# Patient Record
Sex: Female | Born: 1995 | Race: Black or African American | Hispanic: No | Marital: Married | Smoking: Former smoker
Health system: Southern US, Community
[De-identification: ages and names within clinical notes are randomized; demographics above are authoritative.]

## PROBLEM LIST (undated history)

## (undated) ENCOUNTER — Inpatient Hospital Stay (HOSPITAL_COMMUNITY): Payer: Self-pay

## (undated) DIAGNOSIS — N189 Chronic kidney disease, unspecified: Secondary | ICD-10-CM

## (undated) DIAGNOSIS — Z789 Other specified health status: Secondary | ICD-10-CM

## (undated) HISTORY — PX: NO PAST SURGERIES: SHX2092

---
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
## 2014-10-21 ENCOUNTER — Encounter (HOSPITAL_BASED_OUTPATIENT_CLINIC_OR_DEPARTMENT_OTHER): Payer: Self-pay | Admitting: Emergency Medicine

## 2014-10-21 ENCOUNTER — Emergency Department (HOSPITAL_BASED_OUTPATIENT_CLINIC_OR_DEPARTMENT_OTHER)
Admission: EM | Admit: 2014-10-21 | Discharge: 2014-10-21 | Disposition: A | Discharge status: Home / Self Care | Payer: 59 | Attending: Emergency Medicine | Admitting: Emergency Medicine

## 2014-10-21 DIAGNOSIS — Z792 Long term (current) use of antibiotics: Secondary | ICD-10-CM | POA: Insufficient documentation

## 2014-10-21 DIAGNOSIS — Z331 Pregnant state, incidental: Secondary | ICD-10-CM | POA: Diagnosis not present

## 2014-10-21 DIAGNOSIS — Z3401 Encounter for supervision of normal first pregnancy, first trimester: Secondary | ICD-10-CM

## 2014-10-21 DIAGNOSIS — R112 Nausea with vomiting, unspecified: Secondary | ICD-10-CM | POA: Diagnosis present

## 2014-10-21 LAB — URINALYSIS, ROUTINE W REFLEX MICROSCOPIC
BILIRUBIN URINE: NEGATIVE
Glucose, UA: NEGATIVE mg/dL
Hgb urine dipstick: NEGATIVE
Ketones, ur: NEGATIVE mg/dL
Leukocytes, UA: NEGATIVE
NITRITE: NEGATIVE
Protein, ur: NEGATIVE mg/dL
SPECIFIC GRAVITY, URINE: 1.007 (ref 1.005–1.030)
UROBILINOGEN UA: 0.2 mg/dL (ref 0.0–1.0)
pH: 6.5 (ref 5.0–8.0)

## 2014-10-21 LAB — PREGNANCY, URINE: PREG TEST UR: POSITIVE — AB

## 2014-10-21 NOTE — ED Notes (Signed)
Pt presents to ED with complaints of N/V for the past 2 weeks. PT states she could be pregnant.

## 2014-10-21 NOTE — Discharge Instructions (Signed)
First Trimester of Pregnancy The first trimester of pregnancy is from week 1 until the end of week 12 (months 1 through 3). During this time, your baby will begin to develop inside you. At 6-8 weeks, the eyes and face are formed, and the heartbeat can be seen on ultrasound. At the end of 12 weeks, all the baby's organs are formed. Prenatal care is all the medical care you receive before the birth of your baby. Make sure you get good prenatal care and follow all of your doctor's instructions. HOME CARE  Medicines  Take medicine only as told by your doctor. Some medicines are safe and some are not during pregnancy.  Take your prenatal vitamins as told by your doctor.  Take medicine that helps you poop (stool softener) as needed if your doctor says it is okay. Diet  Eat regular, healthy meals.  Your doctor will tell you the amount of weight gain that is right for you.  Avoid raw meat and uncooked cheese.  If you feel sick to your stomach (nauseous) or throw up (vomit):  Eat 4 or 5 small meals a day instead of 3 large meals.  Try eating a few soda crackers.  Drink liquids between meals instead of during meals.  If you have a hard time pooping (constipation):  Eat high-fiber foods like fresh vegetables, fruit, and whole grains.  Drink enough fluids to keep your pee (urine) clear or pale yellow. Activity and Exercise  Exercise only as told by your doctor. Stop exercising if you have cramps or pain in your lower belly (abdomen) or low back.  Try to avoid standing for long periods of time. Move your legs often if you must stand in one place for a long time.  Avoid heavy lifting.  Wear low-heeled shoes. Sit and stand up straight.  You can have sex unless your doctor tells you not to. Relief of Pain or Discomfort  Wear a good support bra if your breasts are sore.  Take warm water baths (sitz baths) to soothe pain or discomfort caused by hemorrhoids. Use hemorrhoid cream if your  doctor says it is okay.  Rest with your legs raised if you have leg cramps or low back pain.  Wear support hose if you have puffy, bulging veins (varicose veins) in your legs. Raise (elevate) your feet for 15 minutes, 3-4 times a day. Limit salt in your diet. Prenatal Care  Schedule your prenatal visits by the twelfth week of pregnancy.  Write down your questions. Take them to your prenatal visits.  Keep all your prenatal visits as told by your doctor. Safety  Wear your seat belt at all times when driving.  Make a list of emergency phone numbers. The list should include numbers for family, friends, the hospital, and police and fire departments. General Tips  Ask your doctor for a referral to a local prenatal class. Begin classes no later than at the start of month 6 of your pregnancy.  Ask for help if you need counseling or help with nutrition. Your doctor can give you advice or tell you where to go for help.  Do not use hot tubs, steam rooms, or saunas.  Do not douche or use tampons or scented sanitary pads.  Do not cross your legs for long periods of time.  Avoid litter boxes and soil used by cats.  Avoid all smoking, herbs, and alcohol. Avoid drugs not approved by your doctor.  Visit your dentist. At home, brush your teeth   with a soft toothbrush. Be gentle when you floss. GET HELP IF:  You are dizzy.  You have mild cramps or pressure in your lower belly.  You have a nagging pain in your belly area.  You continue to feel sick to your stomach, throw up, or have watery poop (diarrhea).  You have a bad smelling fluid coming from your vagina.  You have pain with peeing (urination).  You have increased puffiness (swelling) in your face, hands, legs, or ankles. GET HELP RIGHT AWAY IF:   You have a fever.  You are leaking fluid from your vagina.  You have spotting or bleeding from your vagina.  You have very bad belly cramping or pain.  You gain or lose weight  rapidly.  You throw up blood. It may look like coffee grounds.  You are around people who have German measles, fifth disease, or chickenpox.  You have a very bad headache.  You have shortness of breath.  You have any kind of trauma, such as from a fall or a car accident. Document Released: 04/27/2008 Document Revised: 03/26/2014 Document Reviewed: 09/19/2013 Exit[employer] Patient Information 2015 [employer]. This information is not intended to replace advice given to you by your health care provider. Make sure you discuss any questions you have with your health care provider.  

## 2014-10-21 NOTE — ED Provider Notes (Signed)
CSN: [id number]     Arrival date & time 10/21/14  1431 History  This chart was scribed for Tilden FossaElizabeth Nafeesah Lapaglia, MD by Roxy Cedarhandni Bhalodia, ED Scribe. This patient was seen in room MH07/MH07 and the patient's care was started at 5:18 PM.   Chief Complaint  Patient presents with  . Nausea  . Emesis   Patient is a 18 y.o. female presenting with vomiting. The history is provided by the patient. No language interpreter was used.  Emesis Severity:  Moderate Duration:  2 weeks Timing:  Intermittent Number of daily episodes:  1 Quality:  Undigested food Able to tolerate:  Liquids and solids Progression:  Worsening Chronicity:  New Context: not post-tussive   Relieved by:  Nothing Worsened by:  Nothing tried Ineffective treatments:  None tried Associated symptoms: no abdominal pain and no fever    HPI Comments: Krystal Todd is a [age] y.o. female with no chronic medical conditions, who presents to the Emergency Department complaining of nausea and multiple episodes of emesis that began 2 weeks ago. She states she usually has emesis once a day. She is able to keep some food and fluids down. Patient is unsure if she is pregnant. She denies associated fever, abdominal pain, dysuria, diarrhea. She is a smoker and drinks alcohol occasionally. She states her LNMP was 2 months ago.  History reviewed. No pertinent past medical history. History reviewed. No pertinent past surgical history. No family history on file. History  Substance Use Topics  . Smoking status: Never Smoker   . Smokeless tobacco: Not on file  . Alcohol Use: Yes   OB History    No data available     Review of Systems  Gastrointestinal: Positive for nausea and vomiting. Negative for abdominal pain.  All other systems reviewed and are negative.  Allergies  Review of patient's allergies indicates no known allergies.  Home Medications   Prior to Admission medications   Medication Sig Start Date End Date Taking? Authorizing  Provider  metroNIDAZOLE (FLAGYL) 500 MG tablet Take 1 tablet (500 mg total) by mouth 2 (two) times daily. 12/10/13   Hope Orlene OchM Neese, NP  promethazine (PHENERGAN) 25 MG tablet Take 1 tablet (25 mg total) by mouth every 6 (six) hours as needed for nausea. 12/15/12   Gilda Creasehristopher J. Pollina, MD  sulfamethoxazole-trimethoprim (SEPTRA DS) 800-160 MG per tablet Take 1 tablet by mouth every 12 (twelve) hours. 12/15/12   Gilda Creasehristopher J. Pollina, MD   Triage Vitals: BP 137/74 mmHg  Pulse 70  Temp(Src) 97.9 F (36.6 C) (Oral)  Resp 18  Ht 5\' 2"  (1.575 m)  Wt 120 lb (54.432 kg)  BMI 21.94 kg/m2  SpO2 100%  LMP 08/21/2014  Physical Exam  Constitutional: She is oriented to person, place, and time. She appears well-developed and well-nourished.  HENT:  Head: Normocephalic and atraumatic.  Cardiovascular: Normal rate and regular rhythm.   No murmur heard. Pulmonary/Chest: Effort normal and breath sounds normal. No respiratory distress.  Abdominal: Soft. There is no tenderness. There is no rebound and no guarding.  Musculoskeletal: She exhibits no edema or tenderness.  Neurological: She is alert and oriented to person, place, and time.  Skin: Skin is warm and dry.  Psychiatric: She has a normal mood and affect. Her behavior is normal.  Nursing note and vitals reviewed.  ED Course  Procedures (including critical care time)  DIAGNOSTIC STUDIES: Oxygen Saturation is 100% on RA, normal by my interpretation.    COORDINATION OF CARE: 5:20 PM- Discussed plans  to order diagnostic urinalysis, and pregnancy test.   5:24 PM- Discussed positive pregnancy results. Performed ultrasound of abdomen. Discussed plans to discharge. Pt advised of plan for treatment and pt agrees.  Labs Review Labs Reviewed  PREGNANCY, URINE - Abnormal; Notable for the following:    Preg Test, Ur POSITIVE (*)    All other components within normal limits  URINALYSIS, ROUTINE W REFLEX MICROSCOPIC   Imaging Review No results  found.   EKG Interpretation None     MDM   Final diagnoses:  First pregnancy, first trimester    Patient here for evaluation of nausea and vomiting. Patient nontoxic and well-hydrated on exam. Patient with only small volume emesis daily. Do not feel like electrolyte or renal function testing warranted at this time. Patient is pregnant on urinalysis. Bedside ultrasound demonstrates gestational sac with the exact in the uterus. Discussed with patient's OB/GYN follow-up as well as home care with by mouth fluid hydration and prenatal vitamins. Discussed smoking cessation with the patient.  I personally performed the services described in this documentation, which was scribed in my presence. The recorded information has been reviewed and is accurate.  Tilden FossaElizabeth Jaeanna Mccomber, MD 10/22/14 [phone]

## 2014-11-08 ENCOUNTER — Encounter (HOSPITAL_COMMUNITY): Payer: Self-pay

## 2014-11-08 DIAGNOSIS — Z3A08 8 weeks gestation of pregnancy: Secondary | ICD-10-CM | POA: Diagnosis not present

## 2014-11-08 DIAGNOSIS — O2391 Unspecified genitourinary tract infection in pregnancy, first trimester: Secondary | ICD-10-CM | POA: Insufficient documentation

## 2014-11-08 DIAGNOSIS — Z79899 Other long term (current) drug therapy: Secondary | ICD-10-CM | POA: Insufficient documentation

## 2014-11-08 DIAGNOSIS — O209 Hemorrhage in early pregnancy, unspecified: Secondary | ICD-10-CM | POA: Insufficient documentation

## 2014-11-08 NOTE — ED Notes (Signed)
Pt states she is [redacted] weeks pregnant, tonight she complains of bright red spotting for the last hour, no clots.

## 2014-11-09 ENCOUNTER — Emergency Department (HOSPITAL_COMMUNITY)
Admission: EM | Admit: 2014-11-09 | Discharge: 2014-11-09 | Disposition: A | Discharge status: Home / Self Care | Payer: 59 | Attending: Emergency Medicine | Admitting: Emergency Medicine

## 2014-11-09 ENCOUNTER — Emergency Department (HOSPITAL_COMMUNITY): Payer: 59

## 2014-11-09 DIAGNOSIS — O209 Hemorrhage in early pregnancy, unspecified: Secondary | ICD-10-CM

## 2014-11-09 DIAGNOSIS — N939 Abnormal uterine and vaginal bleeding, unspecified: Secondary | ICD-10-CM

## 2014-11-09 DIAGNOSIS — N390 Urinary tract infection, site not specified: Secondary | ICD-10-CM

## 2014-11-09 DIAGNOSIS — O2341 Unspecified infection of urinary tract in pregnancy, first trimester: Secondary | ICD-10-CM

## 2014-11-09 LAB — WET PREP, GENITAL
Clue Cells Wet Prep HPF POC: NONE SEEN
Trich, Wet Prep: NONE SEEN
Yeast Wet Prep HPF POC: NONE SEEN

## 2014-11-09 LAB — URINALYSIS, ROUTINE W REFLEX MICROSCOPIC
BILIRUBIN URINE: NEGATIVE
GLUCOSE, UA: NEGATIVE mg/dL
KETONES UR: NEGATIVE mg/dL
LEUKOCYTES UA: NEGATIVE
Nitrite: NEGATIVE
PROTEIN: NEGATIVE mg/dL
Specific Gravity, Urine: 1.025 (ref 1.005–1.030)
Urobilinogen, UA: 0.2 mg/dL (ref 0.0–1.0)
pH: 6 (ref 5.0–8.0)

## 2014-11-09 LAB — URINE MICROSCOPIC-ADD ON

## 2014-11-09 LAB — HIV ANTIBODY (ROUTINE TESTING W REFLEX): HIV: NONREACTIVE

## 2014-11-09 LAB — HCG, QUANTITATIVE, PREGNANCY: HCG, BETA CHAIN, QUANT, S: 90422 m[IU]/mL — AB (ref <5)

## 2014-11-09 MED ORDER — CEPHALEXIN 500 MG PO CAPS: 500.0000 mg | ORAL_CAPSULE | Freq: Two times a day (BID) | ORAL | Status: DC

## 2014-11-09 NOTE — ED Provider Notes (Signed)
CSN: [id number]     Arrival date & time 11/08/14  2304 History   First MD Initiated Contact with Patient 11/09/14 0017     Chief Complaint  Patient presents with  . Vaginal Bleeding     (Consider location/radiation/quality/duration/timing/severity/associated sxs/prior Treatment) HPI  Krystal Todd is a 18 y.o. female G1 P0 at [redacted] weeks gestation (per the patient) presenting for bright red setting over the last hour. She denies any large blood clots or any abdominal pain. This is her first pregnancy. She denies taking any medications and has not obtained a formal ultrasound or obstetrics appointment. She's denying any fevers or recent infections. She has no urinary symptoms. Patient has no further complaints.  10 Systems reviewed and are negative for acute change except as noted in the HPI.   History reviewed. No pertinent past medical history. History reviewed. No pertinent past surgical history. History reviewed. No pertinent family history. History  Substance Use Topics  . Smoking status: Never Smoker   . Smokeless tobacco: Not on file  . Alcohol Use: Yes   OB History    No data available     Review of Systems    Allergies  Review of patient's allergies indicates no known allergies.  Home Medications   Prior to Admission medications   Medication Sig Start Date End Date Taking? Authorizing Provider  metroNIDAZOLE (FLAGYL) 500 MG tablet Take 1 tablet (500 mg total) by mouth 2 (two) times daily. 12/10/13   Hope Orlene OchM Neese, NP  promethazine (PHENERGAN) 25 MG tablet Take 1 tablet (25 mg total) by mouth every 6 (six) hours as needed for nausea. 12/15/12   Gilda Creasehristopher J. Pollina, MD  sulfamethoxazole-trimethoprim (SEPTRA DS) 800-160 MG per tablet Take 1 tablet by mouth every 12 (twelve) hours. 12/15/12   Gilda Creasehristopher J. Pollina, MD   BP 121/73 mmHg  Pulse 90  Temp(Src) 99.7 F (37.6 C) (Oral)  Resp 20  SpO2 100%  LMP 08/21/2014 Physical Exam  Constitutional: She is oriented  to person, place, and time. She appears well-developed and well-nourished. No distress.  HENT:  Head: Normocephalic and atraumatic.  Nose: Nose normal.  Mouth/Throat: Oropharynx is clear and moist. No oropharyngeal exudate.  Eyes: Conjunctivae and EOM are normal. Pupils are equal, round, and reactive to light. No scleral icterus.  Neck: Normal range of motion. Neck supple. No JVD present. No tracheal deviation present. No thyromegaly present.  Cardiovascular: Normal rate, regular rhythm and normal heart sounds.  Exam reveals no gallop and no friction rub.   No murmur heard. Pulmonary/Chest: Effort normal and breath sounds normal. No respiratory distress. She has no wheezes. She exhibits no tenderness.  Abdominal: Soft. Bowel sounds are normal. She exhibits no distension and no mass. There is no tenderness. There is no rebound and no guarding.  Genitourinary:  Cervical os is closed.  Musculoskeletal: Normal range of motion. She exhibits no edema or tenderness.  Lymphadenopathy:    She has no cervical adenopathy.  Neurological: She is alert and oriented to person, place, and time. No cranial nerve deficit. She exhibits normal muscle tone.  Skin: Skin is warm and dry. No rash noted. She is not diaphoretic. No erythema. No pallor.  Nursing note and vitals reviewed.   ED Course  Procedures (including critical care time) Labs Review Labs Reviewed  WET PREP, GENITAL - Abnormal; Notable for the following:    WBC, Wet Prep HPF POC RARE (*)    All other components within normal limits  URINALYSIS, ROUTINE W REFLEX  MICROSCOPIC - Abnormal; Notable for the following:    Hgb urine dipstick LARGE (*)    All other components within normal limits  HCG, QUANTITATIVE, PREGNANCY - Abnormal; Notable for the following:    hCG, Beta Chain, Quant, S 16109 (*)    All other components within normal limits  URINE MICROSCOPIC-ADD ON - Abnormal; Notable for the following:    Squamous Epithelial / LPF FEW (*)     Bacteria, UA MANY (*)    All other components within normal limits  URINE CULTURE  GC/CHLAMYDIA PROBE AMP  HIV ANTIBODY (ROUTINE TESTING)    Imaging Review US Ob Comp Less 14 Wks  11/09/2014   CLINICAL DATA:  Vaginal bleeding for 1 day, [redacted] weeks pregnant.  EXAM: OBSTETRIC <14 WK [place] AND TRANSVAGINAL OB US  TECHNIQUE: Both transabdominal and transvaginal ultrasound examinations were performed for complete evaluation of the gestation as well as the maternal uterus, adnexal regions, and pelvic cul-de-sac. Transvaginal technique was performed to assess early pregnancy.  COMPARISON:  None.  FINDINGS: Intrauterine gestational sac: Visualized/normal in shape.  Yolk sac:  Present  Embryo:  Present  Cardiac Activity: Present  Heart Rate:  185 bpm  CRL:   23.7  mm   9 w 1 d                  [place] EDC: June 13, 2015  Maternal uterus/adnexae: Adnexal not identified. Normal appearance of the uterus. No free fluid.  IMPRESSION: Single live intrauterine pregnancy, gestational age [redacted] weeks and 1 day by ultrasound, EDD June 13, 2015 without immediate complications.   Electronically Signed   By: Awilda Metro   On: 11/09/2014 02:26   US Ob Transvaginal  11/09/2014   CLINICAL DATA:  Vaginal bleeding for 1 day, [redacted] weeks pregnant.  EXAM: OBSTETRIC <14 WK [place] AND TRANSVAGINAL OB US  TECHNIQUE: Both transabdominal and transvaginal ultrasound examinations were performed for complete evaluation of the gestation as well as the maternal uterus, adnexal regions, and pelvic cul-de-sac. Transvaginal technique was performed to assess early pregnancy.  COMPARISON:  None.  FINDINGS: Intrauterine gestational sac: Visualized/normal in shape.  Yolk sac:  Present  Embryo:  Present  Cardiac Activity: Present  Heart Rate:  185 bpm  CRL:   23.7  mm   9 w 1 d                  [place] EDC: June 13, 2015  Maternal uterus/adnexae: Adnexal not identified. Normal appearance of the uterus. No free fluid.  IMPRESSION: Single live intrauterine pregnancy,  gestational age [redacted] weeks and 1 day by ultrasound, EDD June 13, 2015 without immediate complications.   Electronically Signed   By: Awilda Metro   On: 11/09/2014 02:26     EKG Interpretation None      MDM   Final diagnoses:  Vaginal bleeding before [redacted] weeks gestation    Patient presents emergency department out of concern for vaginal bleeding.   UA shows bacteruria, will treat in the setting of pregnancy.  Ultrasound reveals live IUP. Ectopic pregnancy. My pelvic exam reveals a closed cervical os. Patient was advised to follow-up with an OB/GYN physician within 3 days for continued management. Her vital signs were within her normal limits and she is safe for discharge.  EMERGENCY DEPARTMENT [place] PREGNANCY "Study: Limited Ultrasound of the Pelvis"  INDICATIONS:Vaginal bleeding Multiple views of the uterus and pelvic cavity are obtained with a multi-frequency probe.  APPROACH:Transabdominal   PERFORMED BY: Myself  IMAGES ARCHIVED?: Yes  LIMITATIONS: Decompressed bladder  PREGNANCY FREE FLUID: None  PREGNANCY UTERUS FINDINGS:Uterus enlarged ADNEXAL FINDINGS:Right ovary not seen  PREGNANCY FINDINGS: Fetal heart activity seen  INTERPRETATION: Viable intrauterine pregnancy  GESTATIONAL AGE, ESTIMATE: 8 weeks per patient  FETAL HEART RATE: did not obtain       Tomasita CrumbleAdeleke Gaylyn Berish, MD 11/09/14 [phone]

## 2014-11-09 NOTE — Discharge Instructions (Signed)
Asymptomatic Bacteriuria [name], you were seen today for vaginal bleeding. Your ultrasound results are below. Your urine does show bacteria, please take antibiotics as prescribed. Follow-up with an OB/GYN physician within 3 days for continued management. If symptoms worsen come back to emergency department and medially for repeat evaluation. Thank you.  IMPRESSION: Single live intrauterine pregnancy, gestational age 539 weeks and 1 day by ultrasound, EDD June 13, 2015 without immediate Complications.   Asymptomatic bacteriuria is the presence of a large number of bacteria in your urine without the usual symptoms of burning or frequent urination. The following conditions increase the risk of asymptomatic bacteriuria:  Diabetes mellitus.  Advanced age.  Pregnancy in the first trimester.  Kidney stones.  Kidney transplants.  Leaky kidney tube valve in young children (reflux). Treatment for this condition is not needed in most people and can lead to other problems such as too much yeast and growth of resistant bacteria. However, some people, such as pregnant women, do need treatment to prevent kidney infection. Asymptomatic bacteriuria in pregnancy is also associated with fetal growth restriction, premature labor, and newborn death. HOME CARE INSTRUCTIONS Monitor your condition for any changes. The following actions may help to relieve any discomfort you are feeling:  Drink enough water and fluids to keep your urine clear or pale yellow. Go to the bathroom more often to keep your bladder empty.  Keep the area around your vagina and rectum clean. Wipe yourself from front to back after urinating. SEEK IMMEDIATE MEDICAL CARE IF:  You develop signs of an infection such as:  Burning with urination.  Frequency of voiding.  Back pain.  Fever.  You have blood in the urine.  You develop a fever. MAKE SURE YOU:  Understand these instructions.  Will watch your condition.  Will  get help right away if you are not doing well or get worse. Document Released: 11/09/2005 Document Revised: 03/26/2014 Document Reviewed: 05/01/2013 [facility] Patient Information 2015 [place], [place]. This information is not intended to replace advice given to you by your health care provider. Make sure you discuss any questions you have with your health care provider. Vaginal Bleeding During Pregnancy, First Trimester A small amount of bleeding (spotting) from the vagina is common in early pregnancy. Sometimes the bleeding is normal and is not a problem, and sometimes it is a sign of something serious. Be sure to tell your doctor about any bleeding from your vagina right away. HOME CARE  Watch your condition for any changes.  Follow your doctor's instructions about how active you can be.  If you are on bed rest:  You may need to stay in bed and only get up to use the bathroom.  You may be allowed to do some activities.  If you need help, make plans for someone to help you.  Write down:  The number of pads you use each day.  How often you change pads.  How soaked (saturated) your pads are.  Do not use tampons.  Do not douche.  Do not have sex or orgasms until your doctor says it is okay.  If you pass any tissue from your vagina, save the tissue so you can show it to your doctor.  Only take medicines as told by your doctor.  Do not take aspirin because it can make you bleed.  Keep all follow-up visits as told by your doctor. GET HELP IF:   You bleed from your vagina.  You have cramps.  You have labor pains.  You have a fever that does not go away after you take medicine. GET HELP RIGHT AWAY IF:   You have very bad cramps in your back or belly (abdomen).  You pass large clots or tissue from your vagina.  You bleed more.  You feel light-headed or weak.  You pass out (faint).  You have chills.  You are leaking fluid or have a gush of fluid from your  vagina.  You pass out while pooping (having a bowel movement). MAKE SURE YOU:  Understand these instructions.  Will watch your condition.  Will get help right away if you are not doing well or get worse. Document Released: 03/26/2014 Document Reviewed: 07/17/2013 [facility] Patient Information 2015 [place], [place]. This information is not intended to replace advice given to you by your health care provider. Make sure you discuss any questions you have with your health care provider.

## 2014-11-10 LAB — URINE CULTURE: Colony Count: 60000

## 2014-11-10 LAB — GC/CHLAMYDIA PROBE AMP
CT Probe RNA: NEGATIVE
GC Probe RNA: NEGATIVE

## 2014-12-27 LAB — OB RESULTS CONSOLE HIV ANTIBODY (ROUTINE TESTING): HIV: NONREACTIVE

## 2014-12-27 LAB — OB RESULTS CONSOLE GC/CHLAMYDIA
Chlamydia: NEGATIVE
GC PROBE AMP, GENITAL: NEGATIVE

## 2014-12-27 LAB — OB RESULTS CONSOLE ABO/RH: RH Type: POSITIVE

## 2014-12-27 LAB — OB RESULTS CONSOLE RUBELLA ANTIBODY, IGM: Rubella: IMMUNE

## 2014-12-27 LAB — OB RESULTS CONSOLE ANTIBODY SCREEN: Antibody Screen: NEGATIVE

## 2014-12-27 LAB — OB RESULTS CONSOLE HEPATITIS B SURFACE ANTIGEN: Hepatitis B Surface Ag: NEGATIVE

## 2014-12-27 LAB — OB RESULTS CONSOLE RPR: RPR: NONREACTIVE

## 2015-03-08 ENCOUNTER — Emergency Department (HOSPITAL_COMMUNITY)
Admission: EM | Admit: 2015-03-08 | Discharge: 2015-03-09 | Disposition: A | Discharge status: Home / Self Care | Payer: Medicaid Other | Attending: Emergency Medicine | Admitting: Emergency Medicine

## 2015-03-08 ENCOUNTER — Encounter (HOSPITAL_COMMUNITY): Payer: Self-pay | Admitting: Emergency Medicine

## 2015-03-08 DIAGNOSIS — R109 Unspecified abdominal pain: Secondary | ICD-10-CM | POA: Diagnosis not present

## 2015-03-08 DIAGNOSIS — O9989 Other specified diseases and conditions complicating pregnancy, childbirth and the puerperium: Secondary | ICD-10-CM | POA: Insufficient documentation

## 2015-03-08 DIAGNOSIS — M549 Dorsalgia, unspecified: Secondary | ICD-10-CM | POA: Insufficient documentation

## 2015-03-08 DIAGNOSIS — O26899 Other specified pregnancy related conditions, unspecified trimester: Secondary | ICD-10-CM

## 2015-03-08 DIAGNOSIS — Z79899 Other long term (current) drug therapy: Secondary | ICD-10-CM | POA: Insufficient documentation

## 2015-03-08 LAB — URINALYSIS, ROUTINE W REFLEX MICROSCOPIC
Bilirubin Urine: NEGATIVE
GLUCOSE, UA: 250 mg/dL — AB
Ketones, ur: NEGATIVE mg/dL
Nitrite: NEGATIVE
Protein, ur: NEGATIVE mg/dL
Specific Gravity, Urine: 1.019 (ref 1.005–1.030)
UROBILINOGEN UA: 0.2 mg/dL (ref 0.0–1.0)
pH: 6.5 (ref 5.0–8.0)

## 2015-03-08 LAB — URINE MICROSCOPIC-ADD ON

## 2015-03-08 MED ORDER — SODIUM CHLORIDE 0.9 % IV BOLUS (SEPSIS)
1000.0000 mL | Freq: Once | INTRAVENOUS | Status: AC
  Administered 2015-03-08: 1000 mL via INTRAVENOUS

## 2015-03-08 NOTE — ED Notes (Signed)
Awake. Verbally responsive. Resp even and unlabored. No audible adventitious breath sounds noted. ABC's intact. Abd soft/nondistended but tender to palpate. BS (+) and active x4 quadrants. No N/V/D reported. Pt reported abd tightness and 6months pregnant. Pt reported that the tightness has eased up some.

## 2015-03-08 NOTE — ED Notes (Signed)
OB nurse at bedside 

## 2015-03-08 NOTE — ED Provider Notes (Signed)
CSN: [id number]     Arrival date & time 03/08/15  2137 History   First MD Initiated Contact with Patient 03/08/15 2211     Chief Complaint  Patient presents with  . abdominal tightness      (Consider location/radiation/quality/duration/timing/severity/associated sxs/prior Treatment) The history is provided by the patient and medical records. No language interpreter was used.     Krystal Todd is a 19 y.o. female  G1P0 with a no major medical problems presents to the Emergency Department complaining of gradual, persistent, progressively worsening and now improving "abdominal tightness" onset approx 1 hour PTA.  Pt reports that 2 hours PTA she began to have low back pain that was bilateral and constant.  She reports she then became angry and "flipped out on someone."  She reports that while this was happening she began to feel her abd tighten. She states it became tight and stayed that way until after her arrival in the ED.  She denies waxing and waning tightness and all abd pain.  She denies being hit or kicked in the abdomen. She denies vaginal discharge or leakage of fluid. [facility]  Pt reports normal pregnancy to this point. Nothing seems to make the symptoms better or worse.  She denies fever, chills, headache, neck pain, chest pain, shortness of breath, nausea, vomiting, diarrhea, weakness, dizziness, syncope, dysuria.   History reviewed. No pertinent past medical history. History reviewed. No pertinent past surgical history. History reviewed. No pertinent family history. History  Substance Use Topics  . Smoking status: Never Smoker   . Smokeless tobacco: Never Used  . Alcohol Use: No   OB History    Gravida Para Term Preterm AB TAB SAB Ectopic Multiple Living   1              Review of Systems  Constitutional: Negative for fever, diaphoresis, appetite change, fatigue and unexpected weight change.  HENT: Negative for mouth sores.   Eyes: Negative  for visual disturbance.  Respiratory: Negative for cough, chest tightness, shortness of breath and wheezing.   Cardiovascular: Negative for chest pain.  Gastrointestinal: Positive for abdominal pain. Negative for nausea, vomiting, diarrhea and constipation.  Endocrine: Negative for polydipsia, polyphagia and polyuria.  Genitourinary: Negative for dysuria, urgency, frequency and hematuria.  Musculoskeletal: Positive for back pain. Negative for neck stiffness.  Skin: Negative for rash.  Allergic/Immunologic: Negative for immunocompromised state.  Neurological: Negative for syncope, light-headedness and headaches.  Hematological: Does not bruise/bleed easily.  Psychiatric/Behavioral: Negative for sleep disturbance. The patient is not nervous/anxious.       Allergies  Review of patient's allergies indicates no known allergies.  Home Medications   Prior to Admission medications   Medication Sig Start Date End Date Taking? Authorizing Provider  cephALEXin (KEFLEX) 500 MG capsule Take 1 capsule (500 mg total) by mouth 2 (two) times daily. Patient not taking: Reported on 03/08/2015 11/09/14   Tomasita CrumbleAdeleke Oni, MD  Prenatal Vit-Fe Fumarate-FA (PRENATAL MULTIVITAMIN) TABS tablet Take 1 tablet by mouth daily at 12 noon.    Historical Provider, MD   BP 118/73 mmHg  Pulse 96  Temp(Src) 98.3 F (36.8 C) (Oral)  Resp 18  SpO2 100%  LMP 08/21/2014 Physical Exam  Constitutional: She appears well-developed and well-nourished. No distress.  Awake, alert, nontoxic appearance  HENT:  Head: Normocephalic and atraumatic.  Mouth/Throat: Oropharynx is clear and moist. No oropharyngeal exudate.  Eyes: Conjunctivae are normal. No scleral icterus.  Neck: Normal range of motion. Neck supple.  Cardiovascular: Normal rate, regular rhythm, normal heart sounds and intact distal pulses.   Pulmonary/Chest: Effort normal and breath sounds normal. No respiratory distress. She has no wheezes.  Equal chest expansion   Abdominal: Soft. Bowel sounds are normal. She exhibits no mass. There is no tenderness. There is no rebound, no guarding and no CVA tenderness.  Gravid uterus palpable Abdomen soft and nontender No palpable contractions No CVA tenderness  Musculoskeletal: Normal range of motion. She exhibits no edema.  Neurological: She is alert.  Speech is clear and goal oriented Moves extremities without ataxia  Skin: Skin is warm and dry. She is not diaphoretic.  Psychiatric: She has a normal mood and affect.  Nursing note and vitals reviewed.   ED Course  Procedures (including critical care time) Labs Review Labs Reviewed  URINALYSIS, ROUTINE W REFLEX MICROSCOPIC - Abnormal; Notable for the following:    APPearance CLOUDY (*)    Glucose, UA 250 (*)    Hgb urine dipstick SMALL (*)    Leukocytes, UA LARGE (*)    All other components within normal limits  URINE MICROSCOPIC-ADD ON - Abnormal; Notable for the following:    Squamous Epithelial / LPF FEW (*)    Bacteria, UA FEW (*)    Crystals CA OXALATE CRYSTALS (*)    All other components within normal limits  URINE CULTURE    Imaging Review No results found.   EKG Interpretation None      MDM   Final diagnoses:  Abdominal pain complicating pregnancy   Krystal Todd presents with abdominal tightness after stressful event. Patient denies discrete or repetitive contractions however rapid OB nurse at bedside and toco placed with 1 clear contraction since onset of monitoring. Will give fluids, check UA and monitor for approximately one hour.  12:14 AM Pt continues to contract approx 2/28min.  Will continue to monitor, finish fluids and discuss with her OB/GYN.  Urine is borderline with large leukocytes but no nitrites. White blood cells 3-6.  Pt denies urinary symptoms.  12:47 AM Pt with decreasing frequency of contractions. A rapid OB and nurse has spoken with patient's personal OB who recommends discharge home with close  outpatient follow-up. Patient denies all urinary symptoms. Culture pending and OB/GYN will follow on culture.  Dahlia Client Timothy Trudell, PA-C 03/09/15 1610  Rolan Bucco, MD 03/09/15 [phone]

## 2015-03-08 NOTE — ED Notes (Signed)
Awake. Verbally responsive. A/O x4. Resp even and unlabored. No audible adventitious breath sounds noted. ABC's intact. IV in NS at 91499ml/hr without difficulty. Family at bedside. OB nurse at bedside. Pt connected to fetal monitor.

## 2015-03-08 NOTE — ED Notes (Signed)
Pt reports she flipped out on her neighbors and now her stomach is tight with lower back pain. Pt is [redacted] weeks pregnant.

## 2015-03-09 NOTE — ED Notes (Signed)
Awake. Verbally responsive. A/O x4. Resp even and unlabored. No audible adventitious breath sounds noted. ABC's intact. IV infused NS without difficulty. Continues to be monitor on fetal monitoring.  Family at bedside.

## 2015-03-09 NOTE — ED Notes (Signed)
Awake. Verbally responsive. A/O x4. Resp even and unlabored. No audible adventitious breath sounds noted. ABC's intact.  

## 2015-03-09 NOTE — Progress Notes (Signed)
Notified Dr. Jackelyn KnifeMeisinger of patient @ [facility], complaints and interventions. FHT 140/10x10 accels/no decels, no contractions since IV fluid completed. Dr. Jackelyn KnifeMeisinger ok w/discharge of patient.

## 2015-03-09 NOTE — Discharge Instructions (Signed)
1. Medications: usual home medications 2. Treatment: rest, drink plenty of fluids,  3. Follow Up: Please followup with your OB/GYN in 3 days for discussion of your diagnoses and further evaluation after today's visit; if you do not have a primary care doctor use the resource guide provided to find one; Please return to the ER for return of abd pain or tightness or other concerning symptoms

## 2015-03-11 LAB — URINE CULTURE: Colony Count: 75000

## 2015-04-14 ENCOUNTER — Inpatient Hospital Stay (HOSPITAL_COMMUNITY)
Admission: AD | Admit: 2015-04-14 | Discharge: 2015-04-14 | Disposition: A | Discharge status: Home / Self Care | Payer: Medicaid Other | Source: Ambulatory Visit | Attending: Obstetrics and Gynecology | Admitting: Obstetrics and Gynecology

## 2015-04-14 ENCOUNTER — Encounter (HOSPITAL_COMMUNITY): Payer: Self-pay | Admitting: *Deleted

## 2015-04-14 DIAGNOSIS — Z3A31 31 weeks gestation of pregnancy: Secondary | ICD-10-CM | POA: Diagnosis not present

## 2015-04-14 DIAGNOSIS — O4703 False labor before 37 completed weeks of gestation, third trimester: Secondary | ICD-10-CM

## 2015-04-14 DIAGNOSIS — Z87891 Personal history of nicotine dependence: Secondary | ICD-10-CM | POA: Diagnosis not present

## 2015-04-14 DIAGNOSIS — Z79899 Other long term (current) drug therapy: Secondary | ICD-10-CM | POA: Insufficient documentation

## 2015-04-14 HISTORY — DX: Other specified health status: Z78.9

## 2015-04-14 LAB — WET PREP, GENITAL
Clue Cells Wet Prep HPF POC: NONE SEEN
Trich, Wet Prep: NONE SEEN

## 2015-04-14 LAB — URINE MICROSCOPIC-ADD ON

## 2015-04-14 LAB — URINALYSIS, ROUTINE W REFLEX MICROSCOPIC
Bilirubin Urine: NEGATIVE
Glucose, UA: NEGATIVE mg/dL
Hgb urine dipstick: NEGATIVE
KETONES UR: NEGATIVE mg/dL
Nitrite: NEGATIVE
PROTEIN: NEGATIVE mg/dL
Specific Gravity, Urine: 1.015 (ref 1.005–1.030)
Urobilinogen, UA: 0.2 mg/dL (ref 0.0–1.0)
pH: 6 (ref 5.0–8.0)

## 2015-04-14 LAB — FETAL FIBRONECTIN: Fetal Fibronectin: NEGATIVE

## 2015-04-14 MED ORDER — TERBUTALINE SULFATE 1 MG/ML IJ SOLN
0.2500 mg | Freq: Once | INTRAMUSCULAR | Status: AC
  Administered 2015-04-14: 0.25 mg via SUBCUTANEOUS
  Filled 2015-04-14: qty 1

## 2015-04-14 NOTE — MAU Note (Signed)
Since about lunch time Sat. Having period-like cramps and lower back pain. Pain is constant. Denies LOF or bleeding

## 2015-04-14 NOTE — Progress Notes (Signed)
Baby moving a lot and difficult to monitor FHR. Transducer adj

## 2015-04-14 NOTE — MAU Provider Note (Signed)
History     CSN: [id number]  Arrival date and time: 04/14/15 4098   First Provider Initiated Contact with Patient 04/14/15 0058      No chief complaint on file.  HPI  19 y.o. G1P0  presents to the MAU stating that she has had consistent not increasing in strength menstrual type cramps since noon today. Senies intercourse, LOF, vaginal bleeding.  Past Medical History  Diagnosis Date  . Medical history non-contributory     Past Surgical History  Procedure Laterality Date  . No past surgeries      Family History  Problem Relation Age of Onset  . Cancer Maternal Grandmother     History  Substance Use Topics  . Smoking status: Former Games developer  . Smokeless tobacco: Never Used  . Alcohol Use: No    Allergies: No Known Allergies  Prescriptions prior to admission  Medication Sig Dispense Refill Last Dose  . ferrous sulfate 325 (65 FE) MG tablet Take 325 mg by mouth daily with breakfast.   04/13/2015 at Unknown time  . Prenatal Vit-Fe Fumarate-FA (PRENATAL MULTIVITAMIN) TABS tablet Take 1 tablet by mouth daily at 12 noon.   Past Week at Unknown time  . cephALEXin (KEFLEX) 500 MG capsule Take 1 capsule (500 mg total) by mouth 2 (two) times daily. (Patient not taking: Reported on 03/08/2015) 14 capsule 0     Review of Systems  Constitutional: Negative for fever.  Gastrointestinal: Positive for abdominal pain.  Genitourinary: Negative for dysuria.  All other systems reviewed and are negative.  Physical Exam   Blood pressure 119/66, pulse 87, temperature 98.1 F (36.7 C), resp. rate 18, height  (1.575 m), weight 68.04 kg (150 lb), last menstrual period 08/21/2014.  Physical Exam  Nursing note and vitals reviewed. Constitutional: She is oriented to person, place, and time. She appears well-developed and well-nourished. No distress.  HENT:  Head: Normocephalic and atraumatic.  Neck: Normal range of motion.  Cardiovascular: Normal rate.   Respiratory: Effort  normal. No respiratory distress.  GI: Soft. She exhibits no distension and no mass. There is no tenderness. There is no rebound and no guarding.  Genitourinary: Vagina normal.  Musculoskeletal: Normal range of motion. She exhibits no edema.  Neurological: She is alert and oriented to person, place, and time.  Skin: Skin is warm and dry.  Psychiatric: She has a normal mood and affect. Her behavior is normal. Judgment and thought content normal.   Dilation: Closed Effacement (%): Thick Cervical Position: Anterior Exam by:: Quintella Baton rNc    Cervix rechecked and no cervical change noted.   Fhr reactive uc's irregular 5-9 minutes apart. Results for orders placed or performed during the hospital encounter of 04/14/15 (from the past 24 hour(s))  Urinalysis, Routine w reflex microscopic     Status: Abnormal   Collection Time: 04/14/15 12:55 AM  Result Value Ref Range   Color, Urine YELLOW YELLOW   APPearance CLEAR CLEAR   Specific Gravity, Urine 1.015 1.005 - 1.030   pH 6.0 5.0 - 8.0   Glucose, UA NEGATIVE NEGATIVE mg/dL   Hgb urine dipstick NEGATIVE NEGATIVE   Bilirubin Urine NEGATIVE NEGATIVE   Ketones, ur NEGATIVE NEGATIVE mg/dL   Protein, ur NEGATIVE NEGATIVE mg/dL   Urobilinogen, UA 0.2 0.0 - 1.0 mg/dL   Nitrite NEGATIVE NEGATIVE   Leukocytes, UA MODERATE (A) NEGATIVE  Urine microscopic-add on     Status: Abnormal   Collection Time: 04/14/15 12:55 AM  Result Value Ref Range  Squamous Epithelial / LPF FEW (A) RARE   WBC, UA 3-6 <3 WBC/hpf   Bacteria, UA RARE RARE  Fetal fibronectin     Status: None   Collection Time: 04/14/15  1:30 AM  Result Value Ref Range   Fetal Fibronectin NEGATIVE NEGATIVE  Wet prep, genital     Status: Abnormal   Collection Time: 04/14/15  1:30 AM  Result Value Ref Range   Yeast Wet Prep HPF POC FEW (A) NONE SEEN   Trich, Wet Prep NONE SEEN NONE SEEN   Clue Cells Wet Prep HPF POC NONE SEEN NONE SEEN   WBC, Wet Prep HPF POC FEW (A) NONE SEEN    MAU Course  Procedures  MDM Fetal fibronectin negative and cervix has not changed with irregular contractions. Reported to Dr Ambrose MantleHenley and he wants her to have terbutaline x 1 dose, recheck her cervix and if no change . She may be discharged.  Assessment and Plan  Preterm contractions  Discharge to home  [facility]Clemmons,Kona Yusuf Grissett 04/14/2015, 2:00 AM

## 2015-04-14 NOTE — Progress Notes (Signed)
Written and verbal d/c instructions given and understanding voiced. 

## 2015-04-14 NOTE — Progress Notes (Signed)
Illene BolusLori Clemmons CNM in discussing test results and plan of care

## 2015-04-14 NOTE — Progress Notes (Signed)
Lori Clemmons CNM aware cervix unchanged and irreg ctx with u/i. May d/c home. Aware some yeast on wet prep -no treatment ordered.

## 2015-04-14 NOTE — Progress Notes (Signed)
BAby active and difficult to monitor fhr.

## 2015-04-14 NOTE — Discharge Instructions (Signed)

## 2015-05-15 LAB — OB RESULTS CONSOLE GBS: GBS: NEGATIVE

## 2015-05-28 ENCOUNTER — Encounter (HOSPITAL_COMMUNITY): Payer: Self-pay | Admitting: *Deleted

## 2015-05-28 ENCOUNTER — Inpatient Hospital Stay (HOSPITAL_COMMUNITY)
Admission: AD | Admit: 2015-05-28 | Discharge: 2015-05-28 | Disposition: A | Discharge status: Home / Self Care | Payer: Medicaid Other | Source: Ambulatory Visit | Attending: Obstetrics and Gynecology | Admitting: Obstetrics and Gynecology

## 2015-05-28 DIAGNOSIS — Z3A37 37 weeks gestation of pregnancy: Secondary | ICD-10-CM | POA: Diagnosis not present

## 2015-05-28 DIAGNOSIS — O26893 Other specified pregnancy related conditions, third trimester: Secondary | ICD-10-CM

## 2015-05-28 DIAGNOSIS — O9989 Other specified diseases and conditions complicating pregnancy, childbirth and the puerperium: Secondary | ICD-10-CM | POA: Diagnosis present

## 2015-05-28 DIAGNOSIS — N898 Other specified noninflammatory disorders of vagina: Secondary | ICD-10-CM

## 2015-05-28 DIAGNOSIS — Z87891 Personal history of nicotine dependence: Secondary | ICD-10-CM | POA: Diagnosis not present

## 2015-05-28 NOTE — MAU Note (Signed)
Ferning not noted, Artelia LarocheM Williams CNM to perform speculum.

## 2015-05-28 NOTE — MAU Provider Note (Signed)
  History     CSN: [id number]  Arrival date and time: 05/28/15 0155   First Provider Initiated Contact with Patient 05/28/15 0257      Chief Complaint  Patient presents with  . Rupture of Membranes   HPI This is a 19 y.o. female at 398w6d who presents with c/o several small gushes of fluid from vagina over past hour. Denies bleeding. Reports good fetal movement. Denies painful contractions I was asked to rule out ruptured membranes   RN Note: Several small gushes of fluid over the last 45 minutes. Denies vaginal bleeding or pain. Positive fetal movement.           OB History    Gravida Para Term Preterm AB TAB SAB Ectopic Multiple Living   1         0      Past Medical History  Diagnosis Date  . Medical history non-contributory     Past Surgical History  Procedure Laterality Date  . No past surgeries      Family History  Problem Relation Age of Onset  . Cancer Maternal Grandmother     History  Substance Use Topics  . Smoking status: Former Games developermoker  . Smokeless tobacco: Never Used  . Alcohol Use: No    Allergies: No Known Allergies  Prescriptions prior to admission  Medication Sig Dispense Refill Last Dose  . ferrous sulfate 325 (65 FE) MG tablet Take 325 mg by mouth daily with breakfast.   Past Week at Unknown time  . Prenatal Vit-Fe Fumarate-FA (PRENATAL MULTIVITAMIN) TABS tablet Take 1 tablet by mouth daily at 12 noon.   Past Week at Unknown time   Medical, Surgical, Family and Social histories reviewed and are listed above.  Medications and allergies reviewed.   Review of Systems  Constitutional: Negative for fever and chills.  Gastrointestinal: Negative for nausea, vomiting, abdominal pain, diarrhea and constipation.  Genitourinary: Negative for dysuria.       Gushes clear fluid  Neurological: Negative for dizziness.  Denies other symptoms in other systems  Physical Exam   Blood pressure 128/78, pulse 77, temperature 98.5 F (36.9 C),  temperature source Oral, resp. rate 16, height 5\' 2"  (1.575 m), weight 168 lb 6.4 oz (76.386 kg), last menstrual period 08/21/2014, SpO2 100 %.  Physical Exam  Constitutional: She is oriented to person, place, and time. She appears well-developed and well-nourished. No distress.  HENT:  Head: Normocephalic.  Cardiovascular: Normal rate.   Respiratory: Effort normal. No respiratory distress.  GI: Soft. She exhibits no distension. There is no tenderness. There is no rebound and no guarding.  FHR reactive Irregular mild contractions   Genitourinary: Vagina normal. No vaginal discharge found.  Sterile speculum exam No pooling No ferning  Dilation: 1 Effacement (%): 40 Cervical Position: Middle Station: -3 Presentation: Undeterminable Exam by:: Altha Harmenise Collison RN   Musculoskeletal: Normal range of motion.  Neurological: She is alert and oriented to person, place, and time.  Skin: Skin is warm and dry.  Psychiatric: She has a normal mood and affect.    MAU Course  Procedures  MDM   Assessment and Plan  A:  SIUP at 938w6d       No evidence of ruptured membranes      Not in labor  P:  RN to discuss with Dr Hinton RaoBovard-Stuckert       Probable discharge home  [facility] LLCWILLIAMS,Schwanda Zima 05/28/2015, 3:39 AM

## 2015-05-28 NOTE — Discharge Instructions (Signed)
Braxton Hicks Contractions °Contractions of the uterus can occur throughout pregnancy. Contractions are not always a sign that you are in labor.  °WHAT ARE BRAXTON HICKS CONTRACTIONS?  °Contractions that occur before labor are called Braxton Hicks contractions, or false labor. Toward the end of pregnancy (32-34 weeks), these contractions can develop more often and may become more forceful. This is not true labor because these contractions do not result in opening (dilatation) and thinning of the cervix. They are sometimes difficult to tell apart from true labor because these contractions can be forceful and people have different pain tolerances. You should not feel embarrassed if you go to the hospital with false labor. Sometimes, the only way to tell if you are in true labor is for your health care provider to look for changes in the cervix. °If there are no prenatal problems or other health problems associated with the pregnancy, it is completely safe to be sent home with false labor and await the onset of true labor. °HOW CAN YOU TELL THE DIFFERENCE BETWEEN TRUE AND FALSE LABOR? °False Labor °· The contractions of false labor are usually shorter and not as hard as those of true labor.   °· The contractions are usually irregular.   °· The contractions are often felt in the front of the lower abdomen and in the groin.   °· The contractions may go away when you walk around or change positions while lying down.   °· The contractions get weaker and are shorter lasting as time goes on.   °· The contractions do not usually become progressively stronger, regular, and closer together as with true labor.   °True Labor °· Contractions in true labor last 30-70 seconds, become very regular, usually become more intense, and increase in frequency.   °· The contractions do not go away with walking.   °· The discomfort is usually felt in the top of the uterus and spreads to the lower abdomen and low back.   °· True labor can be  determined by your health care provider with an exam. This will show that the cervix is dilating and getting thinner.   °WHAT TO REMEMBER °· Keep up with your usual exercises and follow other instructions given by your health care provider.   °· Take medicines as directed by your health care provider.   °· Keep your regular prenatal appointments.   °· Eat and drink lightly if you think you are going into labor.   °· If Braxton Hicks contractions are making you uncomfortable:   °¨ Change your position from lying down or resting to walking, or from walking to resting.   °¨ Sit and rest in a tub of warm water.   °¨ Drink 2-3 glasses of water. Dehydration may cause these contractions.   °¨ Do slow and deep breathing several times an hour.   °WHEN SHOULD I SEEK IMMEDIATE MEDICAL CARE? °Seek immediate medical care if: °· Your contractions become stronger, more regular, and closer together.   °· You have fluid leaking or gushing from your vagina.   °· You have a fever.   °· You pass blood-tinged mucus.   °· You have vaginal bleeding.   °· You have continuous abdominal pain.   °· You have low back pain that you never had before.   °· You feel your baby's head pushing down and causing pelvic pressure.   °· Your baby is not moving as much as it used to.   °Document Released: 11/09/2005 Document Revised: 11/14/2013 Document Reviewed: 08/21/2013 °Exit[employer]® Patient Information ©2015 [employer]. This information is not intended to replace advice given to you by your health care   provider. Make sure you discuss any questions you have with your health care provider. ° °

## 2015-05-28 NOTE — MAU Note (Signed)
Several small gushes of fluid over the last 45 minutes. Denies vaginal bleeding or pain. Positive fetal movement.

## 2015-05-28 NOTE — MAU Note (Signed)
Pt to be dc to home.

## 2015-06-13 ENCOUNTER — Inpatient Hospital Stay (HOSPITAL_COMMUNITY): Payer: 59 | Admitting: Anesthesiology

## 2015-06-13 ENCOUNTER — Encounter (HOSPITAL_COMMUNITY): Payer: Self-pay | Admitting: *Deleted

## 2015-06-13 ENCOUNTER — Encounter (HOSPITAL_COMMUNITY)
Admission: AD | Disposition: A | Discharge status: Home / Self Care | Payer: Self-pay | Source: Ambulatory Visit | Attending: Obstetrics and Gynecology

## 2015-06-13 ENCOUNTER — Inpatient Hospital Stay (HOSPITAL_COMMUNITY)
Admission: AD | Admit: 2015-06-13 | Discharge: 2015-06-15 | DRG: 766 | Disposition: A | Discharge status: Home / Self Care | Payer: 59 | Source: Ambulatory Visit | Attending: Obstetrics and Gynecology | Admitting: Obstetrics and Gynecology

## 2015-06-13 DIAGNOSIS — Z87891 Personal history of nicotine dependence: Secondary | ICD-10-CM | POA: Diagnosis not present

## 2015-06-13 DIAGNOSIS — O4292 Full-term premature rupture of membranes, unspecified as to length of time between rupture and onset of labor: Secondary | ICD-10-CM | POA: Diagnosis present

## 2015-06-13 DIAGNOSIS — Z3A40 40 weeks gestation of pregnancy: Secondary | ICD-10-CM | POA: Diagnosis present

## 2015-06-13 LAB — CBC
HCT: 40.6 % (ref 36.0–46.0)
Hemoglobin: 13.8 g/dL (ref 12.0–15.0)
MCH: 31.2 pg (ref 26.0–34.0)
MCHC: 34 g/dL (ref 30.0–36.0)
MCV: 91.6 fL (ref 78.0–100.0)
PLATELETS: 177 10*3/uL (ref 150–400)
RBC: 4.43 MIL/uL (ref 3.87–5.11)
RDW: 14.5 % (ref 11.5–15.5)
WBC: 9.4 10*3/uL (ref 4.0–10.5)

## 2015-06-13 LAB — ABO/RH: ABO/RH(D): A POS

## 2015-06-13 LAB — RPR: RPR: NONREACTIVE

## 2015-06-13 LAB — TYPE AND SCREEN
ABO/RH(D): A POS
Antibody Screen: NEGATIVE

## 2015-06-13 LAB — POCT FERN TEST: POCT FERN TEST: POSITIVE

## 2015-06-13 SURGERY — Surgical Case
Anesthesia: Regional

## 2015-06-13 MED ORDER — CEFAZOLIN SODIUM-DEXTROSE 2-3 GM-% IV SOLR
INTRAVENOUS | Status: AC
  Filled 2015-06-13: qty 50

## 2015-06-13 MED ORDER — SODIUM BICARBONATE 8.4 % IV SOLN
INTRAVENOUS | Status: AC
  Filled 2015-06-13: qty 50

## 2015-06-13 MED ORDER — EPHEDRINE 5 MG/ML INJ: 10.0000 mg | INTRAVENOUS | Status: DC | PRN

## 2015-06-13 MED ORDER — OXYTOCIN 10 UNIT/ML IJ SOLN
INTRAMUSCULAR | Status: AC
  Filled 2015-06-13: qty 4

## 2015-06-13 MED ORDER — LACTATED RINGERS IV SOLN: 500.0000 mL | INTRAVENOUS | Status: DC | PRN

## 2015-06-13 MED ORDER — VITAMIN K1 1 MG/0.5ML IJ SOLN
INTRAMUSCULAR | Status: AC
  Filled 2015-06-13: qty 0.5

## 2015-06-13 MED ORDER — SIMETHICONE 80 MG PO CHEW
80.0000 mg | CHEWABLE_TABLET | ORAL | Status: DC | PRN
  Administered 2015-06-14 (×2): 80 mg via ORAL
  Filled 2015-06-13 (×2): qty 1

## 2015-06-13 MED ORDER — KETOROLAC TROMETHAMINE 30 MG/ML IJ SOLN
INTRAMUSCULAR | Status: AC
  Filled 2015-06-13: qty 1

## 2015-06-13 MED ORDER — OXYTOCIN 40 UNITS IN LACTATED RINGERS INFUSION - SIMPLE MED
1.0000 m[IU]/min | INTRAVENOUS | Status: DC
  Administered 2015-06-13: 2 m[IU]/min via INTRAVENOUS
  Filled 2015-06-13: qty 1000

## 2015-06-13 MED ORDER — CEFAZOLIN SODIUM-DEXTROSE 2-3 GM-% IV SOLR
INTRAVENOUS | Status: DC | PRN
  Administered 2015-06-13: 2 g via INTRAVENOUS

## 2015-06-13 MED ORDER — OXYTOCIN BOLUS FROM INFUSION: 500.0000 mL | INTRAVENOUS | Status: DC

## 2015-06-13 MED ORDER — LACTATED RINGERS IV SOLN
INTRAVENOUS | Status: DC | PRN
  Administered 2015-06-13: 14:00:00 via INTRAVENOUS

## 2015-06-13 MED ORDER — SENNOSIDES-DOCUSATE SODIUM 8.6-50 MG PO TABS
2.0000 | ORAL_TABLET | ORAL | Status: DC
  Administered 2015-06-14 – 2015-06-15 (×2): 2 via ORAL
  Filled 2015-06-13 (×2): qty 2

## 2015-06-13 MED ORDER — PHENYLEPHRINE 40 MCG/ML (10ML) SYRINGE FOR IV PUSH (FOR BLOOD PRESSURE SUPPORT)
80.0000 ug | PREFILLED_SYRINGE | INTRAVENOUS | Status: DC | PRN
  Administered 2015-06-13: 40 ug via INTRAVENOUS
  Administered 2015-06-13: 60 ug via INTRAVENOUS
  Filled 2015-06-13: qty 20

## 2015-06-13 MED ORDER — ACETAMINOPHEN 325 MG PO TABS: 650.0000 mg | ORAL_TABLET | ORAL | Status: DC | PRN

## 2015-06-13 MED ORDER — OXYTOCIN 40 UNITS IN LACTATED RINGERS INFUSION - SIMPLE MED: 62.5000 mL/h | INTRAVENOUS | Status: DC

## 2015-06-13 MED ORDER — LANOLIN HYDROUS EX OINT: 1.0000 "application " | TOPICAL_OINTMENT | CUTANEOUS | Status: DC | PRN

## 2015-06-13 MED ORDER — BUTORPHANOL TARTRATE 1 MG/ML IJ SOLN
INTRAMUSCULAR | Status: AC
  Filled 2015-06-13: qty 1

## 2015-06-13 MED ORDER — SODIUM BICARBONATE 8.4 % IV SOLN
INTRAVENOUS | Status: DC | PRN
  Administered 2015-06-13 (×5): 5 mL via EPIDURAL

## 2015-06-13 MED ORDER — KETOROLAC TROMETHAMINE 30 MG/ML IJ SOLN
30.0000 mg | Freq: Once | INTRAMUSCULAR | Status: AC
  Administered 2015-06-13: 30 mg via INTRAMUSCULAR

## 2015-06-13 MED ORDER — MENTHOL 3 MG MT LOZG
1.0000 | LOZENGE | OROMUCOSAL | Status: DC | PRN
Start: 2015-06-13 — End: 2015-06-15

## 2015-06-13 MED ORDER — LACTATED RINGERS IV SOLN
INTRAVENOUS | Status: DC
  Administered 2015-06-13: 200 mL/h via INTRAUTERINE

## 2015-06-13 MED ORDER — LACTATED RINGERS IV SOLN
INTRAVENOUS | Status: DC
Start: 2015-06-13 — End: 2015-06-15
  Administered 2015-06-13 – 2015-06-14 (×2): via INTRAVENOUS

## 2015-06-13 MED ORDER — ONDANSETRON HCL 4 MG/2ML IJ SOLN: 4.0000 mg | Freq: Four times a day (QID) | INTRAMUSCULAR | Status: DC | PRN

## 2015-06-13 MED ORDER — DIBUCAINE 1 % RE OINT: 1.0000 "application " | TOPICAL_OINTMENT | RECTAL | Status: DC | PRN

## 2015-06-13 MED ORDER — ZOLPIDEM TARTRATE 5 MG PO TABS: 5.0000 mg | ORAL_TABLET | Freq: Every evening | ORAL | Status: DC | PRN

## 2015-06-13 MED ORDER — FLEET ENEMA 7-19 GM/118ML RE ENEM: 1.0000 | ENEMA | RECTAL | Status: DC | PRN

## 2015-06-13 MED ORDER — DIPHENHYDRAMINE HCL 50 MG/ML IJ SOLN: 12.5000 mg | INTRAMUSCULAR | Status: DC | PRN

## 2015-06-13 MED ORDER — WITCH HAZEL-GLYCERIN EX PADS: 1.0000 "application " | MEDICATED_PAD | CUTANEOUS | Status: DC | PRN

## 2015-06-13 MED ORDER — OXYCODONE-ACETAMINOPHEN 5-325 MG PO TABS: 1.0000 | ORAL_TABLET | ORAL | Status: DC | PRN

## 2015-06-13 MED ORDER — DIPHENHYDRAMINE HCL 25 MG PO CAPS
25.0000 mg | ORAL_CAPSULE | Freq: Four times a day (QID) | ORAL | Status: DC | PRN
  Administered 2015-06-13 – 2015-06-14 (×2): 25 mg via ORAL
  Filled 2015-06-13 (×2): qty 1

## 2015-06-13 MED ORDER — TETANUS-DIPHTH-ACELL PERTUSSIS 5-2.5-18.5 LF-MCG/0.5 IM SUSP: 0.5000 mL | Freq: Once | INTRAMUSCULAR | Status: DC

## 2015-06-13 MED ORDER — MORPHINE SULFATE (PF) 0.5 MG/ML IJ SOLN
INTRAMUSCULAR | Status: DC | PRN
  Administered 2015-06-13: 4 mg via EPIDURAL

## 2015-06-13 MED ORDER — BUTORPHANOL TARTRATE 1 MG/ML IJ SOLN
1.0000 mg | INTRAMUSCULAR | Status: DC | PRN
  Administered 2015-06-13: 1 mg via INTRAVENOUS

## 2015-06-13 MED ORDER — LIDOCAINE HCL (PF) 1 % IJ SOLN
INTRAMUSCULAR | Status: DC | PRN
  Administered 2015-06-13 (×2): 4 mL

## 2015-06-13 MED ORDER — MEASLES, MUMPS & RUBELLA VAC ~~LOC~~ INJ: 0.5000 mL | INJECTION | Freq: Once | SUBCUTANEOUS | Status: DC

## 2015-06-13 MED ORDER — LACTATED RINGERS IV SOLN
INTRAVENOUS | Status: DC
  Administered 2015-06-13: 07:00:00 via INTRAVENOUS

## 2015-06-13 MED ORDER — OXYCODONE-ACETAMINOPHEN 5-325 MG PO TABS
1.0000 | ORAL_TABLET | ORAL | Status: DC | PRN
  Administered 2015-06-14 (×2): 1 via ORAL
  Filled 2015-06-13 (×2): qty 1

## 2015-06-13 MED ORDER — ERYTHROMYCIN 5 MG/GM OP OINT
TOPICAL_OINTMENT | OPHTHALMIC | Status: AC
  Filled 2015-06-13: qty 1

## 2015-06-13 MED ORDER — LIDOCAINE-EPINEPHRINE (PF) 2 %-1:200000 IJ SOLN
INTRAMUSCULAR | Status: AC
  Filled 2015-06-13: qty 20

## 2015-06-13 MED ORDER — OXYCODONE-ACETAMINOPHEN 5-325 MG PO TABS
2.0000 | ORAL_TABLET | ORAL | Status: DC | PRN
Start: 2015-06-13 — End: 2015-06-13

## 2015-06-13 MED ORDER — IBUPROFEN 600 MG PO TABS
600.0000 mg | ORAL_TABLET | Freq: Four times a day (QID) | ORAL | Status: DC
  Administered 2015-06-14 – 2015-06-15 (×8): 600 mg via ORAL
  Filled 2015-06-13 (×7): qty 1

## 2015-06-13 MED ORDER — ONDANSETRON HCL 4 MG/2ML IJ SOLN
INTRAMUSCULAR | Status: DC | PRN
  Administered 2015-06-13: 4 mg via INTRAVENOUS

## 2015-06-13 MED ORDER — SCOPOLAMINE 1 MG/3DAYS TD PT72
MEDICATED_PATCH | TRANSDERMAL | Status: DC | PRN
  Administered 2015-06-13: 1 via TRANSDERMAL

## 2015-06-13 MED ORDER — OXYTOCIN 40 UNITS IN LACTATED RINGERS INFUSION - SIMPLE MED: 62.5000 mL/h | INTRAVENOUS | Status: AC

## 2015-06-13 MED ORDER — FENTANYL 2.5 MCG/ML BUPIVACAINE 1/10 % EPIDURAL INFUSION (WH - ANES)
14.0000 mL/h | INTRAMUSCULAR | Status: DC | PRN
  Administered 2015-06-13 (×2): 14 mL/h via EPIDURAL
  Filled 2015-06-13: qty 125

## 2015-06-13 MED ORDER — ONDANSETRON HCL 4 MG/2ML IJ SOLN
INTRAMUSCULAR | Status: AC
  Filled 2015-06-13: qty 2

## 2015-06-13 MED ORDER — OXYTOCIN 10 UNIT/ML IJ SOLN
40.0000 [IU] | INTRAVENOUS | Status: DC | PRN
  Administered 2015-06-13: 40 [IU] via INTRAVENOUS

## 2015-06-13 MED ORDER — PRENATAL MULTIVITAMIN CH
1.0000 | ORAL_TABLET | Freq: Every day | ORAL | Status: DC
  Administered 2015-06-14 – 2015-06-15 (×3): 1 via ORAL
  Filled 2015-06-13 (×2): qty 1

## 2015-06-13 MED ORDER — MAGNESIUM HYDROXIDE 400 MG/5ML PO SUSP
30.0000 mL | ORAL | Status: DC | PRN
Start: 2015-06-13 — End: 2015-06-15

## 2015-06-13 MED ORDER — TERBUTALINE SULFATE 1 MG/ML IJ SOLN: 0.2500 mg | Freq: Once | INTRAMUSCULAR | Status: DC | PRN

## 2015-06-13 MED ORDER — PROPOFOL 10 MG/ML IV BOLUS
INTRAVENOUS | Status: AC
  Filled 2015-06-13: qty 20

## 2015-06-13 MED ORDER — LIDOCAINE HCL (PF) 1 % IJ SOLN: 30.0000 mL | INTRAMUSCULAR | Status: DC | PRN

## 2015-06-13 MED ORDER — SCOPOLAMINE 1 MG/3DAYS TD PT72
MEDICATED_PATCH | TRANSDERMAL | Status: AC
  Filled 2015-06-13: qty 1

## 2015-06-13 MED ORDER — CITRIC ACID-SODIUM CITRATE 334-500 MG/5ML PO SOLN
30.0000 mL | ORAL | Status: DC | PRN
  Administered 2015-06-13: 30 mL via ORAL
  Filled 2015-06-13: qty 15

## 2015-06-13 MED ORDER — MORPHINE SULFATE 0.5 MG/ML IJ SOLN
INTRAMUSCULAR | Status: AC
  Filled 2015-06-13: qty 10

## 2015-06-13 SURGICAL SUPPLY — 33 items
BENZOIN TINCTURE PRP APPL 2/3 (GAUZE/BANDAGES/DRESSINGS) ×3 IMPLANT
CLAMP CORD UMBIL (MISCELLANEOUS) IMPLANT
CLOSURE WOUND 1/2 X4 (GAUZE/BANDAGES/DRESSINGS) ×1
CLOTH BEACON ORANGE TIMEOUT ST (SAFETY) ×3 IMPLANT
CONTAINER PREFILL 10% NBF 15ML (MISCELLANEOUS) IMPLANT
DRAPE SHEET LG 3/4 BI-LAMINATE (DRAPES) IMPLANT
DRSG OPSITE POSTOP 4X10 (GAUZE/BANDAGES/DRESSINGS) ×3 IMPLANT
DURAPREP 26ML APPLICATOR (WOUND CARE) ×3 IMPLANT
ELECT REM PT RETURN 9FT ADLT (ELECTROSURGICAL) ×3
ELECTRODE REM PT RTRN 9FT ADLT (ELECTROSURGICAL) ×1 IMPLANT
EXTRACTOR VACUUM KIWI (MISCELLANEOUS) IMPLANT
EXTRACTOR VACUUM M CUP 4 TUBE (SUCTIONS) IMPLANT
EXTRACTOR VACUUM M CUP 4' TUBE (SUCTIONS)
GLOVE ORTHO TXT STRL SZ7.5 (GLOVE) ×3 IMPLANT
GOWN STRL REUS W/TWL LRG LVL3 (GOWN DISPOSABLE) ×6 IMPLANT
KIT ABG SYR 3ML LUER SLIP (SYRINGE) IMPLANT
NEEDLE HYPO 25X5/8 SAFETYGLIDE (NEEDLE) ×3 IMPLANT
NS IRRIG 1000ML POUR BTL (IV SOLUTION) ×3 IMPLANT
PACK C SECTION WH (CUSTOM PROCEDURE TRAY) ×3 IMPLANT
PAD OB MATERNITY 4.3X12.25 (PERSONAL CARE ITEMS) ×3 IMPLANT
RTRCTR C-SECT PINK 25CM LRG (MISCELLANEOUS) ×3 IMPLANT
STRIP CLOSURE SKIN 1/2X4 (GAUZE/BANDAGES/DRESSINGS) ×2 IMPLANT
SUT CHROMIC 1 CTX 36 (SUTURE) ×6 IMPLANT
SUT PLAIN 0 NONE (SUTURE) IMPLANT
SUT PLAIN 2 0 XLH (SUTURE) IMPLANT
SUT VIC AB 0 CT1 27 (SUTURE) ×4
SUT VIC AB 0 CT1 27XBRD ANBCTR (SUTURE) ×2 IMPLANT
SUT VIC AB 2-0 CT1 (SUTURE) ×3 IMPLANT
SUT VIC AB 2-0 CT1 27 (SUTURE) ×2
SUT VIC AB 2-0 CT1 TAPERPNT 27 (SUTURE) ×1 IMPLANT
SUT VIC AB 4-0 KS 27 (SUTURE) ×3 IMPLANT
TOWEL OR 17X24 6PK STRL BLUE (TOWEL DISPOSABLE) ×3 IMPLANT
TRAY FOLEY CATH SILVER 14FR (SET/KITS/TRAYS/PACK) ×3 IMPLANT

## 2015-06-13 NOTE — Transfer of Care (Signed)
Immediate Anesthesia Transfer of Care Note  Patient: Krystal Todd  Procedure(s) Performed: Procedure(s): CESAREAN SECTION (N/A)  Patient Location: PACU  Anesthesia Type:Epidural  Level of Consciousness: awake, alert , oriented and patient cooperative  Airway & Oxygen Therapy: Patient Spontanous Breathing  Post-op Assessment: Report given to RN and Post -op Vital signs reviewed and stable  Post vital signs: Reviewed and stable  Last Vitals:  Filed Vitals:   06/13/15 1330  BP: 121/82  Pulse: 91  Temp:   Resp:     Complications: No apparent anesthesia complications

## 2015-06-13 NOTE — H&P (Signed)
Krystal Todd, Krystal Todd             ACCOUNT NO.:  [id number]  MEDICAL RECORD NO.:  [id number]  LOCATION:  9171                          FACILITY:  [facility]  PHYSICIAN:  Malachi Pro. Ambrose Mantle, M.D. DATE OF BIRTH:  [birth year]  DATE OF ADMISSION:  06/13/2015 DATE OF DISCHARGE:                             HISTORY & PHYSICAL   PRESENT ILLNESS:  This is a 19 year old black female, para 0, gravida 1, EDC June 12, 2015, admitted with premature rupture of the membranes. The patient began her prenatal course at 16 weeks and 1 day, but she had an ultrasound at 9 weeks and 1 day giving her a due date of June 12, 2015.  Her initial weight was 126 pounds.  At 27 weeks, she complained of decreased fetal movement, fetal heart tones were normal and she had a reactive nonstress test.  At 33 weeks, she had gained 10 pounds in 3 weeks.  Advised to avoid eating out.  At her last prenatal visit on June 10, 2015, her cervix was 1 cm, 20% effaced, blood pressure was normal. The patient had rupture of membranes spontaneously, came to the hospital and rupture of membranes was confirmed.  Blood group and type A positive with a negative antibody.  RPR negative.  Urine culture negative. Hepatitis B surface antigen negative.  HIV negative.  GC and Chlamydia negative.  Varicella immune.  Rubella immune.  Hemoglobin AA.  The patient declined screening test.  One-hour Glucola was 114.  Repeat HIV and RPR negative.  GC and Chlamydia negative and group B strep negative. Ultrasound on January 21, 2015, showed an average gestational age of [redacted] weeks and 4 days, with an [facility] of June 13, 2015.  The ultrasound was normal.  PAST MEDICAL HISTORY:  Reveals having migraines with pregnancy.  PAST SURGICAL HISTORY:  None was reported.  ALLERGIES:  She has no known drug allergies.  No latex or food allergies.  SOCIAL HISTORY:  Former smoker, quit with pregnancy.  No alcohol. States she smoked marijuana 1 month prior to her OB workup.   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  FAMILY HISTORY:  Mother has high blood pressure, heart disease, she also had preeclampsia.  Father with diabetes type 2.  Maternal grandfather with hypercholesterolemia and maternal grandmother with a sarcoma.  PHYSICAL EXAMINATION:  VITAL SIGNS:  On admission, temperature is 98.1, pulse 76, respirations 16, blood pressure 114/73. HEART:  Normal size and sounds.  No murmurs. LUNGS:  Clear to auscultation. GU:  Fundal height at her last prenatal visit was 39 cm.  Fetal heart tones are normal.  Cervix per the admitting RN is 1 cm.  Presenting part is high.  ADMITTING IMPRESSION:  Intrauterine pregnancy at 40 weeks and 1 day. Premature rupture of the membranes confirmed in the Maternity Admission Unit.  The patient is admitted for management of labor.     Malachi Pro. Ambrose Mantle, M.D.     TFH/MEDQ  D:  06/13/2015  T:  06/13/2015  Job:  [id number]

## 2015-06-13 NOTE — Progress Notes (Signed)
Patient ID: Krystal Todd   DOB: [birth year], 19 y.o.   MRN: [id number] Pt is a para 0 at 40 + weeks and is admitted with PROM at 4AM She is contracting painlessly and the cervix is FT 20% and the vertex confirmed by [place] is at - 4 station. I will be transferring her care to Dr. Jackelyn Knife and allow him to decide if and when she will need pitocin.

## 2015-06-13 NOTE — Anesthesia Preprocedure Evaluation (Signed)
Anesthesia Evaluation  Patient identified by MRN, date of birth, ID band Patient awake    Reviewed: Allergy & Precautions, H&P , NPO status , Patient's Chart, lab work & pertinent test results, reviewed documented beta blocker date and time   Airway Mallampati: II  TM Distance: >3 FB Neck ROM: full    Dental no notable dental hx.    Pulmonary neg pulmonary ROS, former smoker,  breath sounds clear to auscultation  Pulmonary exam normal       Cardiovascular negative cardio ROS Normal cardiovascular examRhythm:regular Rate:Normal     Neuro/Psych negative neurological ROS  negative psych ROS   GI/Hepatic negative GI ROS, Neg liver ROS,   Endo/Other  negative endocrine ROS  Renal/GU negative Renal ROS  negative genitourinary   Musculoskeletal   Abdominal   Peds  Hematology negative hematology ROS (+)   Anesthesia Other Findings Pregnancy - uncomplicated Platelets and allergies reviewed Denies active cardiac or pulmonary symptoms, METS > 4  Denies blood thinning medications, bleeding disorders, hypertension, asthma, supine hypotension syndrome, previous anesthesia difficulties    Reproductive/Obstetrics (+) Pregnancy                             Anesthesia Physical Anesthesia Plan  ASA: II  Anesthesia Plan: Epidural   Post-op Pain Management:    Induction:   Airway Management Planned:   Additional Equipment:   Intra-op Plan:   Post-operative Plan:   Informed Consent: I have reviewed the patients History and Physical, chart, labs and discussed the procedure including the risks, benefits and alternatives for the proposed anesthesia with the patient or authorized representative who has indicated his/her understanding and acceptance.     Plan Discussed with:   Anesthesia Plan Comments:         Anesthesia Quick Evaluation  

## 2015-06-13 NOTE — Consult Note (Addendum)
Neonatology Note:   Attendance at C-section:   I was asked by Dr. Meisinger to attend this primary C/S at term due to NRFHR. The mother is a G1P0 A pos, GBS neg with reported decreased fetal movement at 27 weeks (normal nonstress test). Mother admits to use of marijuana in past. ROM 10 hours prior to delivery, fluid clear. Infant vigorous with good spontaneous cry and tone. Needed only minimal bulb suctioning. Ap 9/9. Lungs clear to ausc in DR. To CN to care of Pediatrician.  Krystal Todd C. Aniela Caniglia, MD 

## 2015-06-13 NOTE — MAU Note (Signed)
Pt presents with ROM at 0400

## 2015-06-13 NOTE — Op Note (Signed)
Preoperative diagnosis: Intrauterine pregnancy at 40 weeks, PROM, fetal intolerance of labor Postoperative diagnosis: Same Procedure: Primary low transverse cesarean section without extensions Surgeon: Lavina Hamman M.D. Anesthesia: Epidural Findings: Patient had normal gravid anatomy and delivered a viable female infant with Apgars of 9 and 9 weight pending Estimated blood loss: 800 cc Specimens: Placenta sent for routine pathology Complications: None  Procedure in detail: The patient was taken to the operating room and placed in the dorsosupine position. Her previously placed epidural was dosed appropriately. Abdomen was then prepped and draped in the usual sterile fashion, a foley catheter had previously been inserted. The level of her anesthesia was found to be adequate. Abdomen was entered via a standard Pfannenstiel incision. Once the peritoneal cavity was entered the Alexis disposable self-retaining retractor was placed and good visualization was achieved. A 4 cm transverse incision was then made in the lower uterine segment pushing the bladder inferior. Once the uterine cavity was entered the incision was extended digitally. The fetal vertex was grasped and delivered through the incision atraumatically. Mouth and nares were suctioned. The remainder of the infant then delivered atraumatically. Cord was doubly clamped and cut and the infant handed to the awaiting pediatric team. Cord blood was obtained, I was unable to obtain an arterial cord gas. The placenta delivered spontaneously. Uterus was wiped dry with clean lap pad and all clots and debris were removed. Uterine incision was inspected and found to be free of extensions. Uterine incision was closed in 2 layers with running #1 Chromic. Tubes and ovaries were inspected and found to be normal. Uterine incision was inspected and found to be hemostatic. Bleeding from serosal edges was controlled with electrocautery. The Alexis retractor was  removed. Subfascial space was irrigated and made hemostatic with electrocautery. Peritoneum was closed with 2-0 Vicryl.  Fascia was closed in running fashion starting at both ends and meeting in the middle with 0 Vicryl. Subcutaneous tissue was then irrigated and made hemostatic with electrocautery. Skin was closed with running 4-0 Vicryl subcuticular suture followed by steri-strips and a sterile dressing. Patient tolerated the procedure well and was taken to the recovery in stable condition. Counts were correct x2, she received Ancef 2 g IV at the beginning of the procedure and she had PAS hose on throughout the procedure.

## 2015-06-13 NOTE — Progress Notes (Signed)
FHT unimproved with amnioinfusion, having some more prolonged decels and some that look late. Will proceed with c-section for non-reassuring FHT, procedure and risks discussed with patient.

## 2015-06-13 NOTE — Progress Notes (Signed)
Comfortable with epidural Afeb, VSS FHT- Cat II, some variable decels with ctx, pitocin turned off because variables were recurrent VE-3/70/-2, vtx, IUPC placed Will amnioinfuse and monitor FHT, restart pitocin if FHT improves

## 2015-06-13 NOTE — Anesthesia Procedure Notes (Signed)
Epidural Patient location during procedure: OB  Staffing Anesthesiologist: Carlton Buskey Performed by: anesthesiologist   Preanesthetic Checklist Completed: patient identified, site marked, surgical consent, pre-op evaluation, timeout performed, IV checked, risks and benefits discussed and monitors and equipment checked  Epidural Patient position: sitting Prep: site prepped and draped and DuraPrep Patient monitoring: continuous pulse ox and blood pressure Approach: midline Location: L3-L4 Injection technique: LOR air  Needle:  Needle type: Tuohy  Needle gauge: 17 G Needle length: 9 cm and 9 Needle insertion depth: 5 cm cm Catheter type: closed end flexible Catheter size: 19 Gauge Catheter at skin depth: 9 cm Test dose: negative  Assessment Events: blood not aspirated, injection not painful, no injection resistance, negative IV test and no paresthesia  Additional Notes Patient identified. Risks/Benefits/Options discussed with patient including but not limited to bleeding, infection, nerve damage, paralysis, failed block, incomplete pain control, headache, blood pressure changes, nausea, vomiting, reactions to medication both or allergic, itching and postpartum back pain. Confirmed with bedside nurse the patient's most recent platelet count. Confirmed with patient that they are not currently taking any anticoagulation, have any bleeding history or any family history of bleeding disorders. Patient expressed understanding and wished to proceed. All questions were answered. Sterile technique was used throughout the entire procedure. Please see nursing notes for vital signs. Test dose was given through epidural catheter and negative prior to continuing to dose epidural or start infusion. Warning signs of high block given to the patient including shortness of breath, tingling/numbness in hands, complete motor block, or any concerning symptoms with instructions to call for help. Patient was  given instructions on fall risk and not to get out of bed. All questions and concerns addressed with instructions to call with any issues or inadequate analgesia.     

## 2015-06-13 NOTE — Anesthesia Postprocedure Evaluation (Signed)
  Anesthesia Post-op Note  Patient: Krystal Todd  Procedure(s) Performed: Procedure(s) (LRB): CESAREAN SECTION (N/A)  Patient Location: PACU  Anesthesia Type: Epidural  Level of Consciousness: awake and alert   Airway and Oxygen Therapy: Patient Spontanous Breathing  Post-op Pain: mild  Post-op Assessment: Post-op Vital signs reviewed, Patient's Cardiovascular Status Stable, Respiratory Function Stable, Patent Airway and No signs of Nausea or vomiting  Last Vitals:  Filed Vitals:   06/13/15 1615  BP: 122/87  Pulse: 73  Temp:   Resp: 18    Post-op Vital Signs: stable   Complications: No apparent anesthesia complications

## 2015-06-13 NOTE — Progress Notes (Signed)
Ctx spacing out, will augment with pitocin

## 2015-06-14 LAB — CBC
HEMATOCRIT: 38.4 % (ref 36.0–46.0)
HEMOGLOBIN: 12.8 g/dL (ref 12.0–15.0)
MCH: 30.7 pg (ref 26.0–34.0)
MCHC: 33.3 g/dL (ref 30.0–36.0)
MCV: 92.1 fL (ref 78.0–100.0)
PLATELETS: 161 10*3/uL (ref 150–400)
RBC: 4.17 MIL/uL (ref 3.87–5.11)
RDW: 14.4 % (ref 11.5–15.5)
WBC: 14.9 10*3/uL — AB (ref 4.0–10.5)

## 2015-06-14 MED ORDER — SODIUM CHLORIDE 0.9 % IJ SOLN
3.0000 mL | INTRAMUSCULAR | Status: DC | PRN
  Administered 2015-06-14: 3 mL via INTRAVENOUS
  Filled 2015-06-14: qty 3

## 2015-06-14 NOTE — Progress Notes (Addendum)
Subjective: Postpartum Day #1: Cesarean Delivery Patient reports incisional pain and tolerating PO.    Objective: Vital signs in last 24 hours: Temp:  [97.6 F (36.4 C)-98.4 F (36.9 C)] 98.4 F (36.9 C) (07/22 0125) Pulse Rate:  [69-100] 75 (07/22 0125) Resp:  [15-20] 20 (07/22 0125) BP: (99-139)/(48-98) 113/77 mmHg (07/22 0125) SpO2:  [98 %-100 %] 98 % (07/22 0125)  Physical Exam:  General: alert Lochia: appropriate Uterine Fundus: firm Incision: dressing C/D/I   Recent Labs  06/13/15 0657 06/14/15 0530  HGB 13.8 12.8  HCT 40.6 38.4    Assessment/Plan: Status post Cesarean section. Doing well postoperatively.  Continue current care, ambulate.  Will do circumcision in the office.  Haidar Muse D 06/14/2015, 8:18 AM

## 2015-06-14 NOTE — Lactation Note (Signed)
This note was copied from the chart of Krystal Todd. Lactation Consultation Note  Patient Name: Krystal Todd Date: 06/14/2015 Reason for consult: Initial assessment  Baby 23 hours old. Mom states that baby has been nursing almost continuously, and while she is able to latch baby to left breast, she is having trouble latching to right breast. Offered to assist with latching baby to right breast, but mom declined stating that she is about to give baby to [facility] and take a nap. Enc mom to call out for assistance with latching at the next feeding. Enc mom to nurse with cues.  Mom given [facility] brochure, aware of OP/BFSG, community resources, and [facility] phone line assistance.  Maternal Data    Feeding    LATCH Score/Interventions                      Lactation Tools Discussed/Used     Consult Status Consult Status: PRN    Krystal Todd 06/14/2015, 1:38 PM

## 2015-06-14 NOTE — Anesthesia Postprocedure Evaluation (Signed)
  Anesthesia Post-op Note  Patient: Krystal Todd  Procedure(s) Performed: Procedure(s): CESAREAN SECTION (N/A)  Patient Location: Mother/Baby  Anesthesia Type:Epidural  Level of Consciousness: awake, alert , oriented and patient cooperative  Airway and Oxygen Therapy: Patient Spontanous Breathing  Post-op Pain: none  Post-op Assessment: Post-op Vital signs reviewed, Patient's Cardiovascular Status Stable, Respiratory Function Stable, Patent Airway, No headache, No backache and Patient able to bend at knees  Post-op Vital Signs: Reviewed and stable  Last Vitals:  Filed Vitals:   06/14/15 0125  BP: 113/77  Pulse: 75  Temp: 36.9 C  Resp: 20    Complications: No apparent anesthesia complications

## 2015-06-15 ENCOUNTER — Encounter (HOSPITAL_COMMUNITY): Payer: Self-pay | Admitting: Obstetrics and Gynecology

## 2015-06-15 MED ORDER — PRENATAL MULTIVITAMIN CH: 1.0000 | ORAL_TABLET | Freq: Every day | ORAL | Status: DC

## 2015-06-15 MED ORDER — OXYCODONE-ACETAMINOPHEN 5-325 MG PO TABS: 1.0000 | ORAL_TABLET | Freq: Four times a day (QID) | ORAL | Status: DC | PRN

## 2015-06-15 MED ORDER — IBUPROFEN 800 MG PO TABS: 800.0000 mg | ORAL_TABLET | Freq: Three times a day (TID) | ORAL | Status: DC | PRN

## 2015-06-15 NOTE — Lactation Note (Addendum)
This note was copied from the chart of Krystal Todd. Lactation Consultation Note; mother states she just finished feeding infant for 30 mins. She states that she has some pinching pain when infant latched on and feeding. Offered to do teaching and assist with feeding. Mother declines stating she wants to nap now.  Staff nurse to give patient comfort gels. Mother states that she has an Naval [employer] PIS at home. She states that she post pumped 5-10 ml the last pumping. Reviewed treatment to prevent engorgement. Mother advised to page for next feeding assistance.   Patient Name: Krystal Todd [id number] Date: 06/15/2015 Reason for consult: Follow-up assessment   Maternal Data    Feeding Feeding Type: Breast Fed Length of feed: 30 min (per mom)  LATCH Score/Interventions                      Lactation Tools Discussed/Used     Consult Status      Michel Bickers 06/15/2015, 12:39 PM

## 2015-06-15 NOTE — Progress Notes (Addendum)
Post operative/Partum Day 2 Subjective: no complaints, up ad lib, voiding, tolerating PO and nl lochia, pain controlled  Objective: Blood pressure 117/75, pulse 93, temperature 98.4 F (36.9 C), temperature source Oral, resp. rate 18, height  (1.575 m), weight 76.204 kg (168 lb), last menstrual period 08/21/2014, SpO2 100 %, unknown if currently breastfeeding.  Physical Exam:  General: alert and no distress Lochia: appropriate Uterine Fundus: firm Incision: healing well DVT Evaluation: No evidence of DVT seen on physical exam.   Recent Labs  06/13/15 0657 06/14/15 0530  HGB 13.8 12.8  HCT 40.6 38.4    Assessment/Plan: Plan for discharge tomorrow, Breastfeeding and Lactation consult.  Routine care.  Pt desires d/c today, will d/c with motrin, percocet and PNV, f/u 2 weeks for incision check.    LOS: 2 days   Bovard-Stuckert, Jubal Rademaker 06/15/2015, 7:54 AM

## 2015-06-15 NOTE — Discharge Summary (Signed)
Obstetric Discharge Summary Reason for Admission: rupture of membranes Prenatal Procedures: none Intrapartum Procedures: cesarean: low cervical, transverse Postpartum Procedures: none Complications-Operative and Postpartum: none HEMOGLOBIN  Date Value Ref Range Status  06/14/2015 12.8 12.0 - 15.0 g/dL Final   HCT  Date Value Ref Range Status  06/14/2015 38.4 36.0 - 46.0 % Final    Physical Exam:  General: alert and no distress Lochia: appropriate Uterine Fundus: firm Incision: healing well DVT Evaluation: No evidence of DVT seen on physical exam.  Discharge Diagnoses: Term Pregnancy-delivered  Discharge Information: Date: 06/15/2015 Activity: pelvic rest Diet: routine Medications: PNV, Ibuprofen and Percocet Condition: stable Instructions: refer to practice specific booklet Discharge to: home Follow-up Information    Follow up with MEISINGER,TODD D, MD. Schedule an appointment as soon as possible for a visit in 2 weeks.   Specialty:  Obstetrics and Gynecology   Why:  for incision check, 6 weeks for full postpartum.  Call ASAP to schedule circumcision   Contact information:   [address] [place] [address] [phone]       Newborn Data: Live born female  Birth Weight: 7 lb 6.5 oz (3359 g) APGAR: 9, 9  Home with mother.  Todd, Krystal Novack 06/15/2015, 9:17 AM

## 2015-12-25 ENCOUNTER — Inpatient Hospital Stay (HOSPITAL_COMMUNITY)
Admission: AD | Admit: 2015-12-25 | Discharge: 2015-12-25 | Disposition: A | Discharge status: Home / Self Care | Payer: 59 | Source: Ambulatory Visit | Attending: Obstetrics and Gynecology | Admitting: Obstetrics and Gynecology

## 2015-12-25 ENCOUNTER — Encounter (HOSPITAL_COMMUNITY): Payer: Self-pay | Admitting: *Deleted

## 2015-12-25 DIAGNOSIS — N760 Acute vaginitis: Secondary | ICD-10-CM | POA: Insufficient documentation

## 2015-12-25 DIAGNOSIS — R300 Dysuria: Secondary | ICD-10-CM | POA: Diagnosis present

## 2015-12-25 DIAGNOSIS — A499 Bacterial infection, unspecified: Secondary | ICD-10-CM | POA: Diagnosis not present

## 2015-12-25 DIAGNOSIS — B9689 Other specified bacterial agents as the cause of diseases classified elsewhere: Secondary | ICD-10-CM

## 2015-12-25 DIAGNOSIS — F17200 Nicotine dependence, unspecified, uncomplicated: Secondary | ICD-10-CM | POA: Insufficient documentation

## 2015-12-25 LAB — WET PREP, GENITAL
Sperm: NONE SEEN
TRICH WET PREP: NONE SEEN
YEAST WET PREP: NONE SEEN

## 2015-12-25 LAB — URINALYSIS, ROUTINE W REFLEX MICROSCOPIC
BILIRUBIN URINE: NEGATIVE
Glucose, UA: NEGATIVE mg/dL
Ketones, ur: NEGATIVE mg/dL
Nitrite: NEGATIVE
PROTEIN: NEGATIVE mg/dL
Specific Gravity, Urine: 1.01 (ref 1.005–1.030)
pH: 6.5 (ref 5.0–8.0)

## 2015-12-25 LAB — URINE MICROSCOPIC-ADD ON

## 2015-12-25 LAB — POCT PREGNANCY, URINE: Preg Test, Ur: NEGATIVE

## 2015-12-25 MED ORDER — METRONIDAZOLE 500 MG PO TABS: 500.0000 mg | ORAL_TABLET | Freq: Two times a day (BID) | ORAL | Status: DC

## 2015-12-25 NOTE — Discharge Instructions (Signed)
Bacterial Vaginosis Bacterial vaginosis is an infection of the vagina. It happens when too many germs (bacteria) grow in the vagina. Having this infection puts you at risk for getting other infections from sex. Treating this infection can help lower your risk for other infections, such as:   Chlamydia.  Gonorrhea.  HIV.  Herpes. HOME CARE  Take your medicine as told by your doctor.  Finish your medicine even if you start to feel better.  Tell your sex partner that you have an infection. They should see their doctor for treatment.  During treatment:  Avoid sex or use condoms correctly.  Do not douche.  Do not drink alcohol unless your doctor tells you it is ok.  Do not breastfeed unless your doctor tells you it is ok. GET HELP IF:  You are not getting better after 3 days of treatment.  You have more grey fluid (discharge) coming from your vagina than before.  You have more pain than before.  You have a fever. MAKE SURE YOU:   Understand these instructions.  Will watch your condition.  Will get help right away if you are not doing well or get worse.   This information is not intended to replace advice given to you by your health care provider. Make sure you discuss any questions you have with your health care provider.   Document Released: 08/18/2008 Document Revised: 11/30/2014 Document Reviewed: 06/21/2013 Elsevier Interactive Patient Education 2016 Else[employer] Inc[employer]  Dysuria Dysuria is pain or discomfort while urinating. The pain or discomfort may be felt in the tube that carries urine out of the bladder (urethra) or in the surrounding tissue of the genitals. The pain may also be felt in the groin area, lower abdomen, and lower back. You may have to urinate frequently or have the sudden feeling that you have to urinate (urgency). Dysuria can affect both men and women, but is more common in women. Dysuria can be caused by many different things, including:  Urinary  tract infection in women.  Infection of the kidney or bladder.  Kidney stones or bladder stones.  Certain sexually transmitted infections (STIs), such as chlamydia.  Dehydration.  Inflammation of the vagina.  Use of certain medicines.  Use of certain soaps or scented products that cause irritation. HOME CARE INSTRUCTIONS Watch your dysuria for any changes. The following actions may help to reduce any discomfort you are feeling:  Drink enough fluid to keep your urine clear or pale yellow.  Empty your bladder often. Avoid holding urine for long periods of time.  After a bowel movement or urination, women should cleanse from front to back, using each tissue only once.  Empty your bladder after sexual intercourse.  Take medicines only as directed by your health care provider.  If you were prescribed an antibiotic medicine, finish it all even if you start to feel better.  Avoid caffeine, tea, and alcohol. They can irritate the bladder and make dysuria worse. In men, alcohol may irritate the prostate.  Keep all follow-up visits as directed by your health care provider. This is important.  If you had any tests done to find the cause of dysuria, it is your responsibility to obtain your test results. Ask the lab or department performing the test when and how you will get your results. Talk with your health care provider if you have any questions about your results. SEEK MEDICAL CARE IF:  You develop pain in your back or sides.  You have a fever.  You have nausea or vomiting.  You have blood in your urine.  You are not urinating as often as you usually do. SEEK IMMEDIATE MEDICAL CARE IF:  You pain is severe and not relieved with medicines.  You are unable to hold down any fluids.  You or someone else notices a change in your mental function.  You have a rapid heartbeat at rest.  You have shaking or chills.  You feel extremely weak.   This information is not intended  to replace advice given to you by your health care provider. Make sure you discuss any questions you have with your health care provider.   Document Released: 08/07/2004 Document Revised: 11/30/2014 Document Reviewed: 07/05/2014 Elsevier Interactive Patient Education [employer]

## 2015-12-25 NOTE — MAU Note (Addendum)
PT SAYS  SHE DEL C/S  ON 06-13-2015 -  BY DR   Jackelyn Knife.     BOTTLEFEEDING.       THINKS  SHE HAS UTI-  HAS  BURNING  WITH VOIDING-.    WANTS  STD TESTING - HAS NEW PARTNER  - LAST SEX- 2 WEEKS AGO      NO VAG  D/C.   BIRTH CONTROL-   RIGHT  ARM-  NEXPLANON

## 2015-12-25 NOTE — MAU Provider Note (Signed)
History     CSN: [id number]  Arrival date and time: 12/25/15 0212   First Provider Initiated Contact with Patient 12/25/15 0330      Chief Complaint  Patient presents with  . Dysuria   HPI Krystal Todd is a 20 y.o. G1P1001 who presents to MAU today with complaint of dysuria and desires STD check. She also states 2 days of urinary urgency and frequency. She states recent new partner, they had sex once, but "are not together." she has a Nexplanon and did not use condoms. She denies vaginal discharge, bleeding, fever or abdominal pain.  OB History    Gravida Para Term Preterm AB TAB SAB Ectopic Multiple Living   0 1      Past Medical History  Diagnosis Date  . Medical history non-contributory     Past Surgical History  Procedure Laterality Date  . No past surgeries    . Cesarean section N/A 06/13/2015    Procedure: CESAREAN SECTION;  Surgeon: Lavina Hamman, MD;  Location: [facility];  Service: Obstetrics;  Laterality: N/A;    Family History  Problem Relation Age of Onset  . Cancer Maternal Grandmother     Social History  Substance Use Topics  . Smoking status: Current Every Day Smoker  . Smokeless tobacco: Never Used  . Alcohol Use: Yes     Comment: LAST DRANK-  ON SUN    Allergies: No Known Allergies  Prescriptions prior to admission  Medication Sig Dispense Refill Last Dose  . ferrous sulfate 325 (65 FE) MG tablet Take 325 mg by mouth daily with breakfast.   More than a month at Unknown time  . ibuprofen (ADVIL,MOTRIN) 800 MG tablet Take 1 tablet (800 mg total) by mouth every 8 (eight) hours as needed for moderate pain. 45 tablet 1 More than a month at Unknown time  . oxyCODONE-acetaminophen (PERCOCET/ROXICET) 5-325 MG per tablet Take 1-2 tablets by mouth every 6 (six) hours as needed for severe pain. 40 tablet 0 More than a month at Unknown time  . Prenatal Vit-Fe Fumarate-FA (PRENATAL MULTIVITAMIN) TABS tablet Take 1 tablet by mouth daily at 12  noon. 30 tablet 12 More than a month at Unknown time    Review of Systems  Constitutional: Negative for fever and malaise/fatigue.  Gastrointestinal: Negative for nausea, vomiting, abdominal pain, diarrhea and constipation.  Genitourinary: Positive for dysuria, urgency and frequency.       Neg - vaginal bleeding, discharge   Physical Exam   Blood pressure 111/68, pulse 82, temperature 97.8 F (36.6 C), temperature source Oral, resp. rate 18, height  (1.6 m), weight 176 lb 4 oz (79.946 kg), SpO2 99 %, unknown if currently breastfeeding.  Physical Exam  Nursing note and vitals reviewed. Constitutional: She is oriented to person, place, and time. She appears well-developed and well-nourished. No distress.  HENT:  Head: Normocephalic and atraumatic.  Cardiovascular: Normal rate.   Respiratory: Effort normal.  GI: Soft. She exhibits no distension and no mass. There is no tenderness. There is no rebound and no guarding.  Neurological: She is alert and oriented to person, place, and time.  Skin: Skin is warm and dry. No erythema.  Psychiatric: She has a normal mood and affect.   Results for orders placed or performed during the hospital encounter of 12/25/15 (from the past 24 hour(s))  Urinalysis, Routine w reflex microscopic (not at [facility])     Status: Abnormal   Collection  Time: 12/25/15  2:26 AM  Result Value Ref Range   Color, Urine YELLOW YELLOW   APPearance CLEAR CLEAR   Specific Gravity, Urine 1.010 1.005 - 1.030   pH 6.5 5.0 - 8.0   Glucose, UA NEGATIVE NEGATIVE mg/dL   Hgb urine dipstick LARGE (A) NEGATIVE   Bilirubin Urine NEGATIVE NEGATIVE   Ketones, ur NEGATIVE NEGATIVE mg/dL   Protein, ur NEGATIVE NEGATIVE mg/dL   Nitrite NEGATIVE NEGATIVE   Leukocytes, UA LARGE (A) NEGATIVE  Urine microscopic-add on     Status: Abnormal   Collection Time: 12/25/15  2:26 AM  Result Value Ref Range   Squamous Epithelial / LPF 0-5 (A) NONE SEEN   WBC, UA TOO NUMEROUS TO COUNT 0 - 5  WBC/hpf   RBC / HPF 0-5 0 - 5 RBC/hpf   Bacteria, UA FEW (A) NONE SEEN  Pregnancy, urine POC     Status: None   Collection Time: 12/25/15  2:34 AM  Result Value Ref Range   Preg Test, Ur NEGATIVE NEGATIVE  Wet prep, genital     Status: Abnormal   Collection Time: 12/25/15  2:50 AM  Result Value Ref Range   Yeast Wet Prep HPF POC NONE SEEN NONE SEEN   Trich, Wet Prep NONE SEEN NONE SEEN   Clue Cells Wet Prep HPF POC PRESENT (A) NONE SEEN   WBC, Wet Prep HPF POC FEW (A) NONE SEEN   Sperm NONE SEEN     MAU Course  Procedures None  MDM UPT - negative UA, Wet prep, GC/Chlamydia today  Urine culture ordered Discussed with Dr. Mindi Slicker. Agrees with plan to wait to treat possible UTI until urine culture is final. OK to treat BV tonight.  Assessment and Plan  A:  Bacterial vaginosis Dysuria  P: Discharge home Rx for Flagyl given to patient Urine culture pending Patient advised to follow-up with [facility] as scheduled or sooner if symptoms persist or worsen Patient may return to MAU as needed or if her condition were to change or worsen  Marny Lowenstein, PA-C  12/25/2015, 3:42 AM

## 2015-12-26 LAB — GC/CHLAMYDIA PROBE AMP (~~LOC~~) NOT AT ARMC
CHLAMYDIA, DNA PROBE: NEGATIVE
Neisseria Gonorrhea: NEGATIVE

## 2015-12-27 LAB — URINE CULTURE

## 2015-12-29 ENCOUNTER — Encounter (HOSPITAL_BASED_OUTPATIENT_CLINIC_OR_DEPARTMENT_OTHER): Payer: Self-pay | Admitting: *Deleted

## 2015-12-29 ENCOUNTER — Emergency Department (HOSPITAL_BASED_OUTPATIENT_CLINIC_OR_DEPARTMENT_OTHER)
Admission: EM | Admit: 2015-12-29 | Discharge: 2015-12-29 | Disposition: A | Discharge status: Home / Self Care | Payer: 59 | Attending: Emergency Medicine | Admitting: Emergency Medicine

## 2015-12-29 DIAGNOSIS — N939 Abnormal uterine and vaginal bleeding, unspecified: Secondary | ICD-10-CM | POA: Insufficient documentation

## 2015-12-29 DIAGNOSIS — F17200 Nicotine dependence, unspecified, uncomplicated: Secondary | ICD-10-CM | POA: Insufficient documentation

## 2015-12-29 DIAGNOSIS — Z79899 Other long term (current) drug therapy: Secondary | ICD-10-CM | POA: Insufficient documentation

## 2015-12-29 DIAGNOSIS — Z202 Contact with and (suspected) exposure to infections with a predominantly sexual mode of transmission: Secondary | ICD-10-CM | POA: Diagnosis present

## 2015-12-29 DIAGNOSIS — N390 Urinary tract infection, site not specified: Secondary | ICD-10-CM

## 2015-12-29 DIAGNOSIS — Z792 Long term (current) use of antibiotics: Secondary | ICD-10-CM | POA: Diagnosis not present

## 2015-12-29 DIAGNOSIS — Z3202 Encounter for pregnancy test, result negative: Secondary | ICD-10-CM | POA: Insufficient documentation

## 2015-12-29 LAB — URINALYSIS, ROUTINE W REFLEX MICROSCOPIC
BILIRUBIN URINE: NEGATIVE
GLUCOSE, UA: NEGATIVE mg/dL
Ketones, ur: NEGATIVE mg/dL
Nitrite: NEGATIVE
PH: 6.5 (ref 5.0–8.0)
Protein, ur: 100 mg/dL — AB
SPECIFIC GRAVITY, URINE: 1.021 (ref 1.005–1.030)

## 2015-12-29 LAB — PREGNANCY, URINE: Preg Test, Ur: NEGATIVE

## 2015-12-29 LAB — URINE MICROSCOPIC-ADD ON

## 2015-12-29 MED ORDER — PHENAZOPYRIDINE HCL 200 MG PO TABS: 200.0000 mg | ORAL_TABLET | Freq: Three times a day (TID) | ORAL | Status: DC

## 2015-12-29 MED ORDER — SULFAMETHOXAZOLE-TRIMETHOPRIM 800-160 MG PO TABS
1.0000 | ORAL_TABLET | Freq: Once | ORAL | Status: AC
  Administered 2015-12-29: 1 via ORAL
  Filled 2015-12-29: qty 1

## 2015-12-29 MED ORDER — SULFAMETHOXAZOLE-TRIMETHOPRIM 800-160 MG PO TABS: 1.0000 | ORAL_TABLET | Freq: Two times a day (BID) | ORAL | Status: AC

## 2015-12-29 MED ORDER — PHENAZOPYRIDINE HCL 100 MG PO TABS
200.0000 mg | ORAL_TABLET | Freq: Once | ORAL | Status: AC
  Administered 2015-12-29: 200 mg via ORAL
  Filled 2015-12-29: qty 2

## 2015-12-29 NOTE — ED Notes (Signed)
Pt went to MAU, states that she was given antibiotics to take for BV.  States that she has been taking it for 4 days without relief.

## 2015-12-29 NOTE — ED Provider Notes (Signed)
CSN: [id number]     Arrival date & time 12/29/15  1951 History   First MD Initiated Contact with Patient 12/29/15 2107     Chief Complaint  Patient presents with  . Exposure to STD     (Consider location/radiation/quality/duration/timing/severity/associated sxs/prior Treatment) Patient is a 20 y.o. female presenting with dysuria. The history is provided by the patient.  Dysuria Pain quality:  Burning Pain severity:  Moderate Onset quality:  Gradual Timing:  Constant Progression:  Worsening Chronicity:  New Relieved by:  Nothing Worsened by:  Nothing tried  Krystal Todd is a 20 y.o. female who presents to the ED with urinary frequency, dysuria and urgency that started last week. She reports that she went to the [facility] and they treated her for BV and sent her urine for culture. Someone was supposed to call her if she needed additional antibiotics but she has not received a call and the symptoms are worse.   Past Medical History  Diagnosis Date  . Medical history non-contributory    Past Surgical History  Procedure Laterality Date  . No past surgeries    . Cesarean section N/A 06/13/2015    Procedure: CESAREAN SECTION;  Surgeon: Lavina Hamman, MD;  Location: [facility];  Service: Obstetrics;  Laterality: N/A;   Family History  Problem Relation Age of Onset  . Cancer Maternal Grandmother    Social History  Substance Use Topics  . Smoking status: Current Every Day Smoker  . Smokeless tobacco: Never Used  . Alcohol Use: Yes     Comment: LAST DRANK-  ON SUN   OB History    Gravida Para Term Preterm AB TAB SAB Ectopic Multiple Living   0 1     Review of Systems  Genitourinary: Positive for dysuria, urgency, frequency and vaginal bleeding.  all other systems negative    Allergies  Review of patient's allergies indicates no known allergies.  Home Medications   Prior to Admission medications   Medication Sig Start Date End Date Taking? Authorizing  Provider  ferrous sulfate 325 (65 FE) MG tablet Take 325 mg by mouth daily with breakfast.    Historical Provider, MD  ibuprofen (ADVIL,MOTRIN) 800 MG tablet Take 1 tablet (800 mg total) by mouth every 8 (eight) hours as needed for moderate pain. 06/15/15   Sherian Rein, MD  metroNIDAZOLE (FLAGYL) 500 MG tablet Take 1 tablet (500 mg total) by mouth 2 (two) times daily. 12/25/15   Marny Lowenstein, PA-C  oxyCODONE-acetaminophen (PERCOCET/ROXICET) 5-325 MG per tablet Take 1-2 tablets by mouth every 6 (six) hours as needed for severe pain. 06/15/15   Sherian Rein, MD  phenazopyridine (PYRIDIUM) 200 MG tablet Take 1 tablet (200 mg total) by mouth 3 (three) times daily. 12/29/15   Trease Bremner Orlene Och, NP  Prenatal Vit-Fe Fumarate-FA (PRENATAL MULTIVITAMIN) TABS tablet Take 1 tablet by mouth daily at 12 noon. 06/15/15   Sherian Rein, MD  sulfamethoxazole-trimethoprim (BACTRIM DS,SEPTRA DS) 800-160 MG tablet Take 1 tablet by mouth 2 (two) times daily. 12/29/15 01/05/16  Briyan Kleven Orlene Och, NP   BP 109/69 mmHg  Pulse 94  Temp(Src) 98.3 F (36.8 C) (Oral)  Resp 18  Ht  (1.575 m)  Wt 79.833 kg  BMI 32.18 kg/m2  SpO2 100% Physical Exam  Constitutional: She is oriented to person, place, and time. She appears well-developed and well-nourished. No distress.  HENT:  Head: Normocephalic.  Eyes: Conjunctivae and EOM are normal.  Neck:  Normal range of motion. Neck supple.  Cardiovascular: Normal rate and regular rhythm.   Pulmonary/Chest: Effort normal and breath sounds normal.  Abdominal: Soft. Bowel sounds are normal. There is no tenderness.  Genitourinary:  Pelvic not done since cultures just done 5 days ago.   Musculoskeletal: Normal range of motion.  Neurological: She is alert and oriented to person, place, and time. No cranial nerve deficit.  Skin: Skin is warm and dry.  Psychiatric: She has a normal mood and affect. Her behavior is normal.  Nursing note and vitals reviewed.   ED Course   Procedures (including critical care time) Labs Review Results for orders placed or performed during the hospital encounter of 12/29/15 (from the past 24 hour(s))  Urinalysis, Routine w reflex microscopic (not at [facility])     Status: Abnormal   Collection Time: 12/29/15  7:59 PM  Result Value Ref Range   Color, Urine RED (A) YELLOW   APPearance TURBID (A) CLEAR   Specific Gravity, Urine 1.021 1.005 - 1.030   pH 6.5 5.0 - 8.0   Glucose, UA NEGATIVE NEGATIVE mg/dL   Hgb urine dipstick LARGE (A) NEGATIVE   Bilirubin Urine NEGATIVE NEGATIVE   Ketones, ur NEGATIVE NEGATIVE mg/dL   Protein, ur 161 (A) NEGATIVE mg/dL   Nitrite NEGATIVE NEGATIVE   Leukocytes, UA LARGE (A) NEGATIVE  Pregnancy, urine     Status: None   Collection Time: 12/29/15  7:59 PM  Result Value Ref Range   Preg Test, Ur NEGATIVE NEGATIVE  Urine microscopic-add on     Status: Abnormal   Collection Time: 12/29/15  7:59 PM  Result Value Ref Range   Squamous Epithelial / LPF 0-5 (A) NONE SEEN   WBC, UA TOO NUMEROUS TO COUNT 0 - 5 WBC/hpf   RBC / HPF TOO NUMEROUS TO COUNT 0 - 5 RBC/hpf   Bacteria, UA MANY (A) NONE SEEN    ** I reviewed the patient's chart from her visit at [facility]. Cultures for [facility] and Chlamydia were negative, however, her urine culture was positive for E-Coli. She has not started any medication for UTI.   MDM  20 y.o. female with urinary frequency, urgency and dysuria stable for d/c without fever, CVA tenderness and does not appear toxic. Will start antibiotics and Pyridium. She will follow up with Dr. Janann August in the office tomorrow.  Final diagnoses:  UTI (lower urinary tract infection)       [name], NP 12/29/15 2145  [name], MD 01/25/16 [phone]

## 2016-02-25 IMAGING — US US OB COMP LESS 14 WK
1 series · 14 of 28 positions shown · non-contrast
Comparison: None.

CLINICAL DATA: Vaginal bleeding for 1 day, 8 weeks pregnant.

EXAM:
OBSTETRIC <14 WK US AND TRANSVAGINAL OB US
TECHNIQUE: Both transabdominal and transvaginal ultrasound examinations were
performed for complete evaluation of the gestation as well as the
maternal uterus, adnexal regions, and pelvic cul-de-sac.
Transvaginal technique was performed to assess early pregnancy.

[Series 1: us ob comp less 14 wk · 0.21mm/px · 14 of 34 slices shown]
[im 2/34]
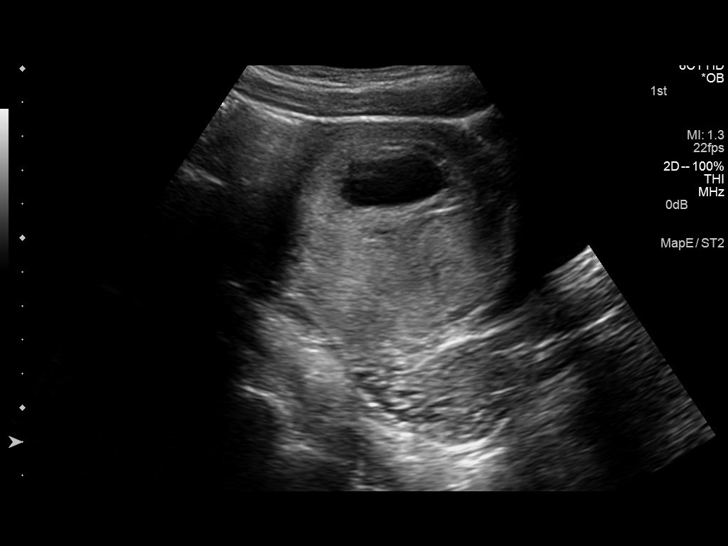
[im 4/34]
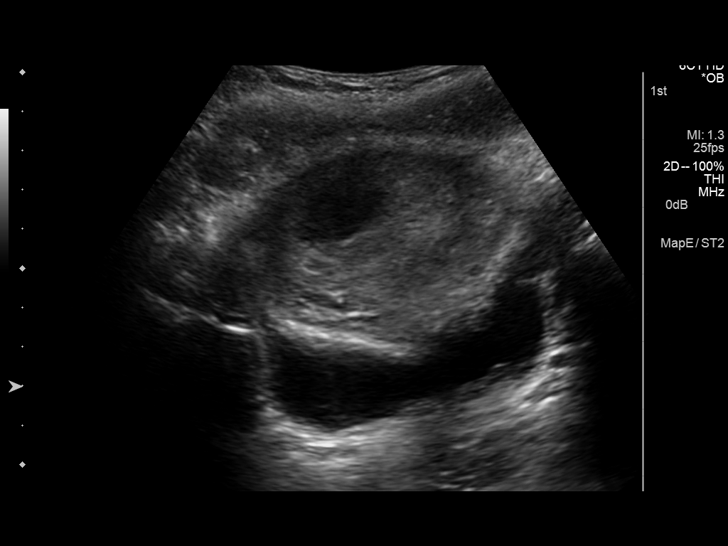
[im 7/34]
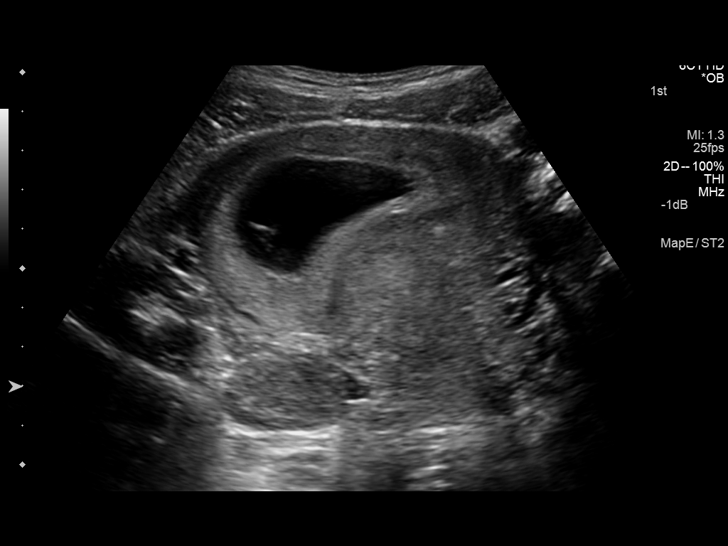
[im 9/34]
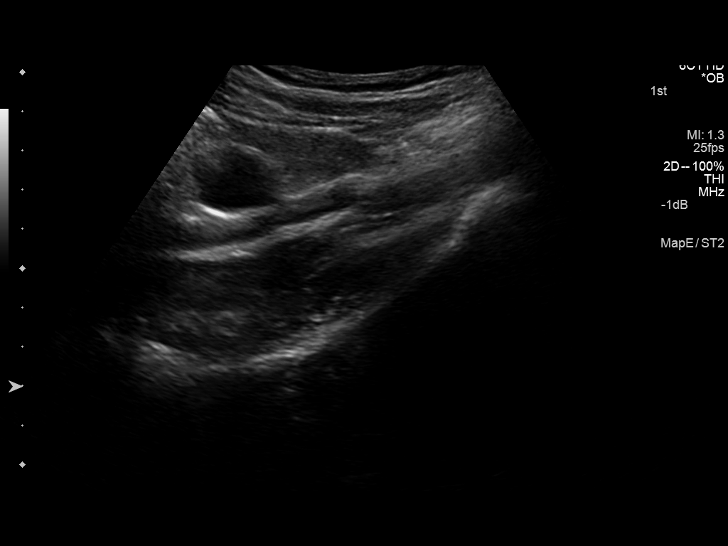
[im 12/34]
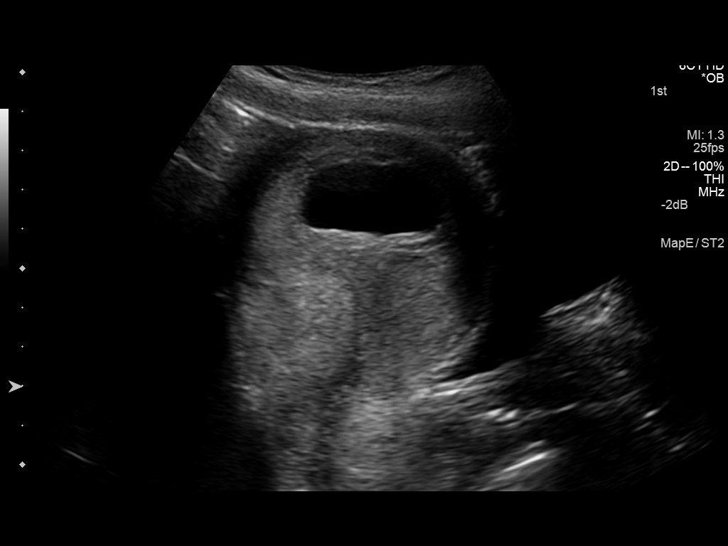
[im 14/34]
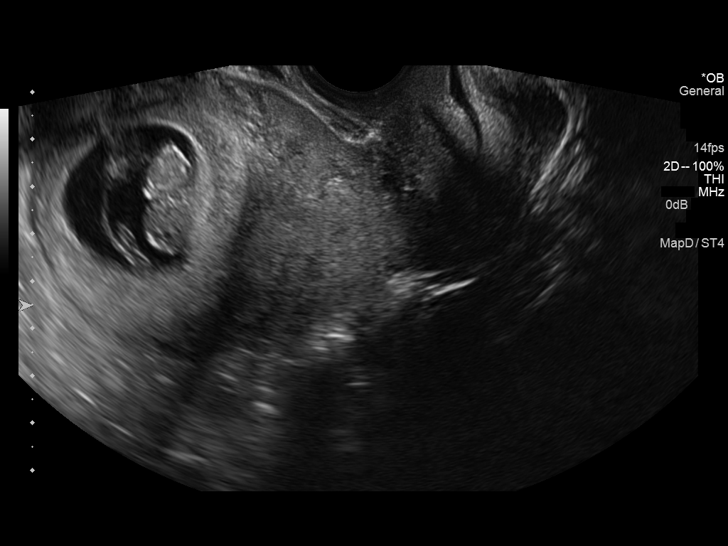
[im 16/34]
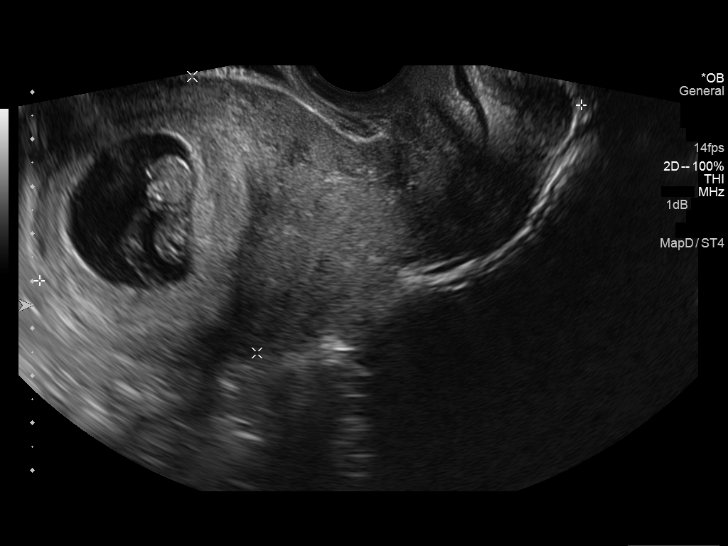
[im 19/34]
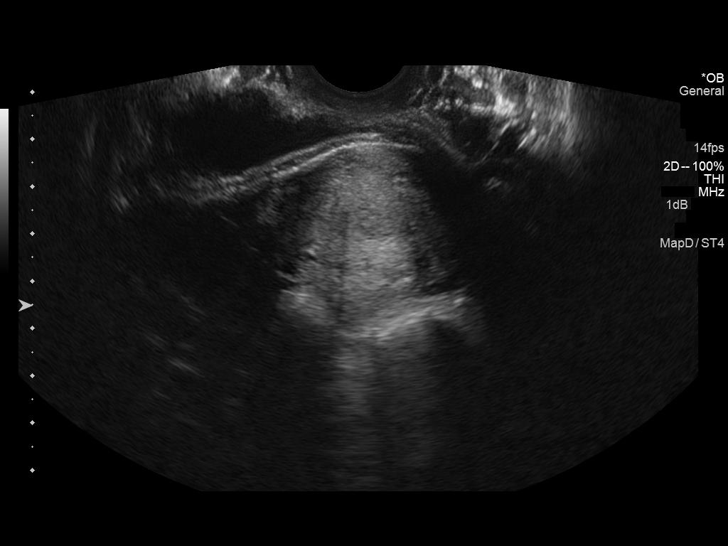
[im 21/34]
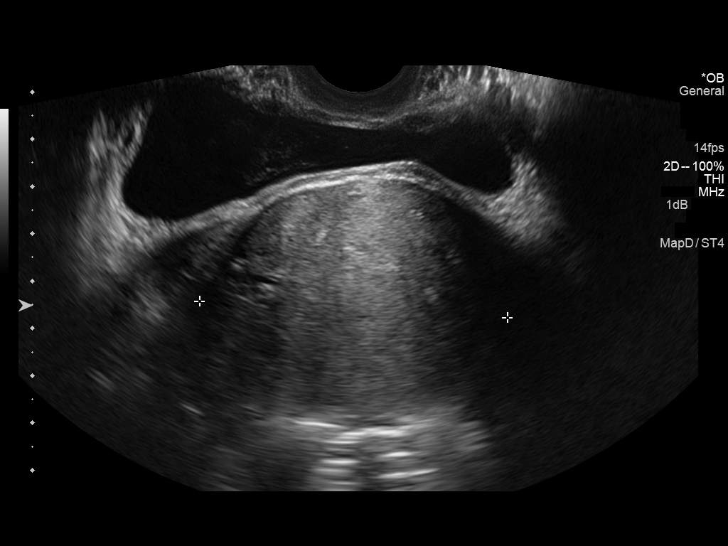
[im 24/34]
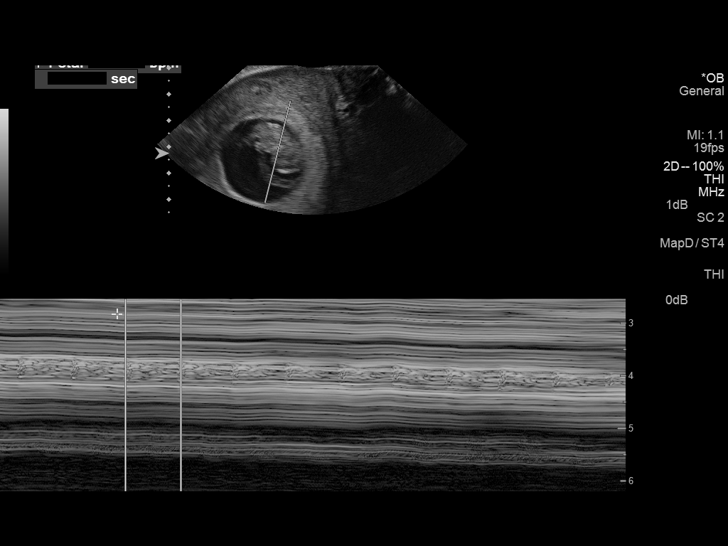
[im 26/34]
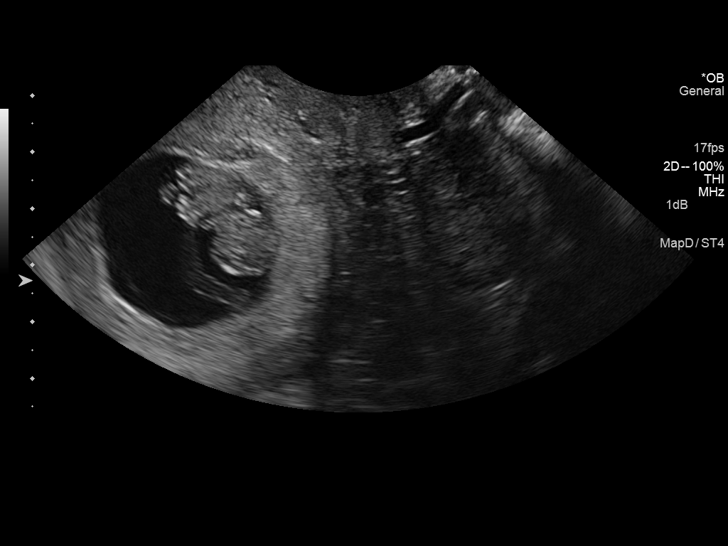
[im 29/34]
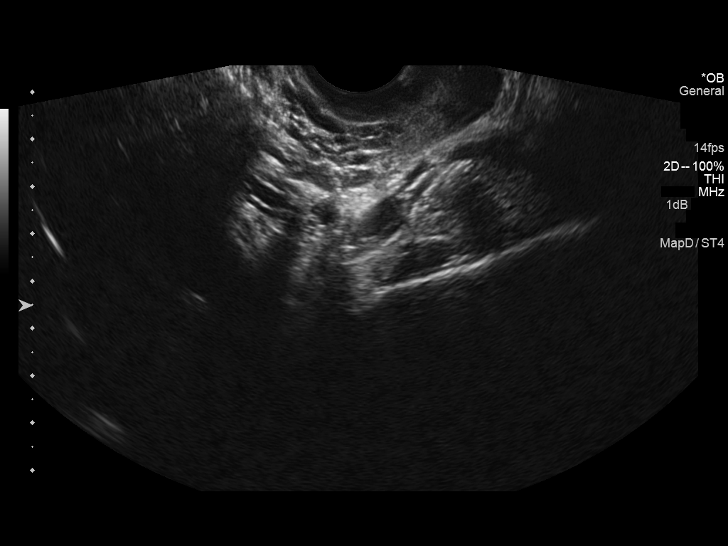
[im 31/34]
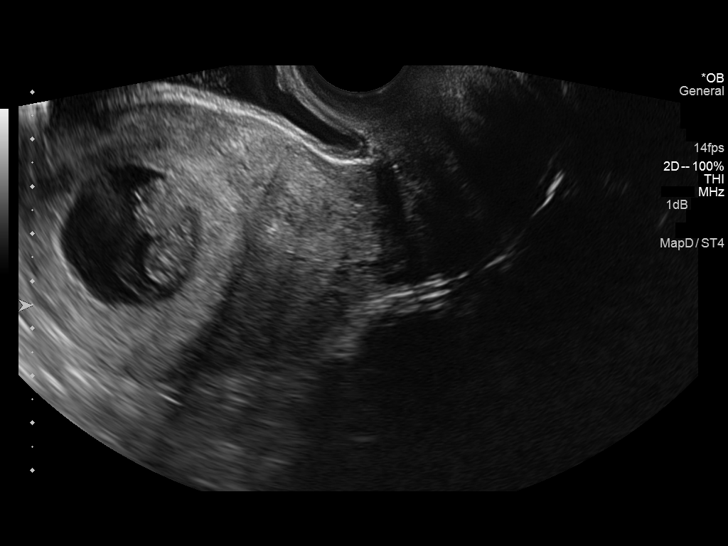
[im 34/34]
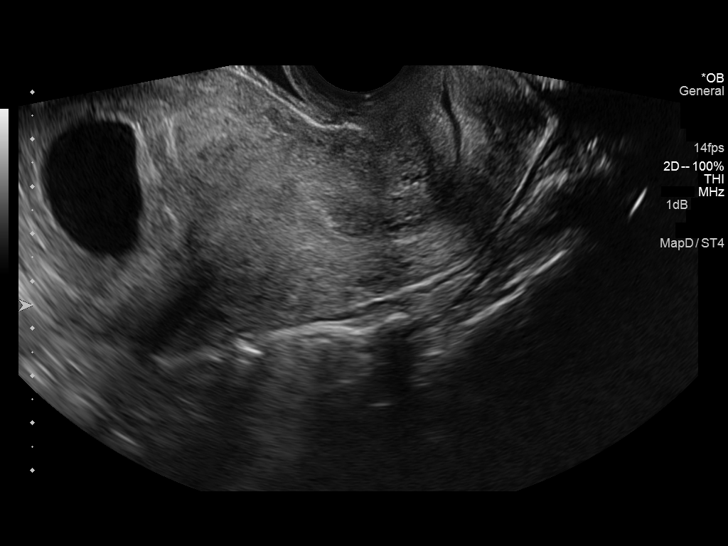

[14 of 28 positions shown; findings below may reference images not displayed]

FINDINGS: Intrauterine gestational sac: Visualized/normal in shape.

Yolk sac:  Present

Embryo:  Present

Cardiac Activity: Present

Heart Rate:  185 bpm

CRL:   23.7  mm   9 w 1 d                  US EDC: June 13, 2015

Maternal uterus/adnexae: Adnexal not identified. Normal appearance
of the uterus. No free fluid.
IMPRESSION: Single live intrauterine pregnancy, gestational age 9 weeks and 1
day by ultrasound, EDD June 13, 2015 without immediate
complications.

  By: Davis Jim

## 2016-03-12 ENCOUNTER — Emergency Department (HOSPITAL_COMMUNITY)
Admission: EM | Admit: 2016-03-12 | Discharge: 2016-03-12 | Disposition: A | Discharge status: Home / Self Care | Payer: 59 | Attending: Emergency Medicine | Admitting: Emergency Medicine

## 2016-03-12 ENCOUNTER — Encounter (HOSPITAL_COMMUNITY): Payer: Self-pay | Admitting: *Deleted

## 2016-03-12 DIAGNOSIS — Z792 Long term (current) use of antibiotics: Secondary | ICD-10-CM | POA: Diagnosis not present

## 2016-03-12 DIAGNOSIS — R300 Dysuria: Secondary | ICD-10-CM

## 2016-03-12 DIAGNOSIS — N939 Abnormal uterine and vaginal bleeding, unspecified: Secondary | ICD-10-CM | POA: Diagnosis not present

## 2016-03-12 DIAGNOSIS — Z9889 Other specified postprocedural states: Secondary | ICD-10-CM | POA: Diagnosis not present

## 2016-03-12 DIAGNOSIS — Z79891 Long term (current) use of opiate analgesic: Secondary | ICD-10-CM | POA: Diagnosis not present

## 2016-03-12 DIAGNOSIS — Z791 Long term (current) use of non-steroidal anti-inflammatories (NSAID): Secondary | ICD-10-CM | POA: Insufficient documentation

## 2016-03-12 DIAGNOSIS — F17200 Nicotine dependence, unspecified, uncomplicated: Secondary | ICD-10-CM | POA: Insufficient documentation

## 2016-03-12 LAB — CBC
HEMATOCRIT: 40 % (ref 36.0–46.0)
Hemoglobin: 13.8 g/dL (ref 12.0–15.0)
MCH: 30.8 pg (ref 26.0–34.0)
MCHC: 34.5 g/dL (ref 30.0–36.0)
MCV: 89.3 fL (ref 78.0–100.0)
Platelets: 360 10*3/uL (ref 150–400)
RBC: 4.48 MIL/uL (ref 3.87–5.11)
RDW: 12.4 % (ref 11.5–15.5)
WBC: 6.1 10*3/uL (ref 4.0–10.5)

## 2016-03-12 LAB — COMPREHENSIVE METABOLIC PANEL
ALBUMIN: 4.2 g/dL (ref 3.5–5.0)
ALT: 37 U/L (ref 14–54)
AST: 21 U/L (ref 15–41)
Alkaline Phosphatase: 91 U/L (ref 38–126)
Anion gap: 10 (ref 5–15)
BUN: 10 mg/dL (ref 6–20)
CHLORIDE: 110 mmol/L (ref 101–111)
CO2: 22 mmol/L (ref 22–32)
CREATININE: 0.68 mg/dL (ref 0.44–1.00)
Calcium: 9 mg/dL (ref 8.9–10.3)
GFR calc Af Amer: >60 mL/min (ref >60)
GFR calc non Af Amer: >60 mL/min (ref >60)
GLUCOSE: 90 mg/dL (ref 65–99)
POTASSIUM: 4 mmol/L (ref 3.5–5.1)
Sodium: 142 mmol/L (ref 135–145)
Total Bilirubin: 0.4 mg/dL (ref 0.3–1.2)
Total Protein: 7.7 g/dL (ref 6.5–8.1)

## 2016-03-12 LAB — URINALYSIS, ROUTINE W REFLEX MICROSCOPIC
BILIRUBIN URINE: NEGATIVE
GLUCOSE, UA: NEGATIVE mg/dL
KETONES UR: NEGATIVE mg/dL
Nitrite: NEGATIVE
PH: 6 (ref 5.0–8.0)
Protein, ur: 30 mg/dL — AB
Specific Gravity, Urine: 1.022 (ref 1.005–1.030)

## 2016-03-12 LAB — URINE MICROSCOPIC-ADD ON: SQUAMOUS EPITHELIAL / LPF: NONE SEEN

## 2016-03-12 LAB — I-STAT BETA HCG BLOOD, ED (MC, WL, AP ONLY)

## 2016-03-12 LAB — WET PREP, GENITAL
CLUE CELLS WET PREP: NONE SEEN
Sperm: NONE SEEN
Trich, Wet Prep: NONE SEEN
WBC, Wet Prep HPF POC: NONE SEEN

## 2016-03-12 MED ORDER — CEPHALEXIN 250 MG PO CAPS: 250.0000 mg | ORAL_CAPSULE | Freq: Four times a day (QID) | ORAL | Status: DC

## 2016-03-12 NOTE — ED Notes (Signed)
Pt states that she has been having UTIs following her son's birth 9 months ago.  Pt states she has had 4 periods since his birth and has had a UTI with every one.  Pt states that she doesn't know if the bleeding is because of the UTI or if it is truly her period and she is just getting UTIs at the same time.  C/o dysuria and vaginal bleeding x 5 days with low abd pain.

## 2016-03-12 NOTE — Discharge Instructions (Signed)
Abnormal Uterine Bleeding Abnormal uterine bleeding can affect women at various stages in life, including teenagers, women in their reproductive years, pregnant women, and women who have reached menopause. Several kinds of uterine bleeding are considered abnormal, including:  Bleeding or spotting between periods.   Bleeding after sexual intercourse.   Bleeding that is heavier or more than normal.   Periods that last longer than usual.  Bleeding after menopause.  Many cases of abnormal uterine bleeding are minor and simple to treat, while others are more serious. Any type of abnormal bleeding should be evaluated by your health care provider. Treatment will depend on the cause of the bleeding. HOME CARE INSTRUCTIONS Monitor your condition for any changes. The following actions may help to alleviate any discomfort you are experiencing:  Avoid the use of tampons and douches as directed by your health care provider.  Change your pads frequently. You should get regular pelvic exams and Pap tests. Keep all follow-up appointments for diagnostic tests as directed by your health care provider.  SEEK MEDICAL CARE IF:   Your bleeding lasts more than 1 week.   You feel dizzy at times.  SEEK IMMEDIATE MEDICAL CARE IF:   You pass out.   You are changing pads every 15 to 30 minutes.   You have abdominal pain.  You have a fever.   You become sweaty or weak.   You are passing large blood clots from the vagina.   You start to feel nauseous and vomit. MAKE SURE YOU:   Understand these instructions.  Will watch your condition.  Will get help right away if you are not doing well or get worse.   This information is not intended to replace advice given to you by your health care provider. Make sure you discuss any questions you have with your health care provider.   Document Released: 11/09/2005 Document Revised: 11/14/2013 Document Reviewed: 06/08/2013 Elsevier Interactive  Patient Education 2016 Elsevier Inc. Dysuria Dysuria is pain or discomfort while urinating. The pain or discomfort may be felt in the tube that carries urine out of the bladder (urethra) or in the surrounding tissue of the genitals. The pain may also be felt in the groin area, lower abdomen, and lower back. You may have to urinate frequently or have the sudden feeling that you have to urinate (urgency). Dysuria can affect both men and women, but is more common in women. Dysuria can be caused by many different things, including:  Urinary tract infection in women.  Infection of the kidney or bladder.  Kidney stones or bladder stones.  Certain sexually transmitted infections (STIs), such as chlamydia.  Dehydration.  Inflammation of the vagina.  Use of certain medicines.  Use of certain soaps or scented products that cause irritation. HOME CARE INSTRUCTIONS Watch your dysuria for any changes. The following actions may help to reduce any discomfort you are feeling:  Drink enough fluid to keep your urine clear or pale yellow.  Empty your bladder often. Avoid holding urine for long periods of time.  After a bowel movement or urination, women should cleanse from front to back, using each tissue only once.  Empty your bladder after sexual intercourse.  Take medicines only as directed by your health care provider.  If you were prescribed an antibiotic medicine, finish it all even if you start to feel better.  Avoid caffeine, tea, and alcohol. They can irritate the bladder and make dysuria worse. In men, alcohol may irritate the prostate.  Keep all   follow-up visits as directed by your health care provider. This is important.  If you had any tests done to find the cause of dysuria, it is your responsibility to obtain your test results. Ask the lab or department performing the test when and how you will get your results. Talk with your health care provider if you have any questions about  your results. SEEK MEDICAL CARE IF:  You develop pain in your back or sides.  You have a fever.  You have nausea or vomiting.  You have blood in your urine.  You are not urinating as often as you usually do. SEEK IMMEDIATE MEDICAL CARE IF:  You pain is severe and not relieved with medicines.  You are unable to hold down any fluids.  You or someone else notices a change in your mental function.  You have a rapid heartbeat at rest.  You have shaking or chills.  You feel extremely weak.   This information is not intended to replace advice given to you by your health care provider. Make sure you discuss any questions you have with your health care provider.   Document Released: 08/07/2004 Document Revised: 11/30/2014 Document Reviewed: 07/05/2014 Elsevier Interactive Patient Education 2016 Elsevier Inc.  

## 2016-03-12 NOTE — ED Provider Notes (Signed)
CSN: [id number]     Arrival date & time 03/12/16  0944 History   First MD Initiated Contact with Patient 03/12/16 1136     Chief Complaint  Patient presents with  . Vaginal Bleeding  . Dysuria     (Consider location/radiation/quality/duration/timing/severity/associated sxs/prior Treatment) HPI Comments: Patient presents emergency department with chief complaint of dysuria and vaginal bleeding. She states that she has been having symptoms for the past 5 days. She reports some intermittent crampy abdominal pain. She denies any focal abdominal pain. Denies any fevers, chills, nausea, vomiting, diarrhea. She states that she thinks she has a UTI. She has had similar symptoms in the past when she has had a UTI. She is uncertain about her last period because she has an IUD. She has follow-up with OB/GYN later today.  The history is provided by the patient. No language interpreter was used.    Past Medical History  Diagnosis Date  . Medical history non-contributory    Past Surgical History  Procedure Laterality Date  . No past surgeries    . Cesarean section N/A 06/13/2015    Procedure: CESAREAN SECTION;  Surgeon: Lavina Hammanodd Meisinger, MD;  Location: [facility];  Service: Obstetrics;  Laterality: N/A;   Family History  Problem Relation Age of Onset  . Cancer Maternal Grandmother    Social History  Substance Use Topics  . Smoking status: Current Every Day Smoker  . Smokeless tobacco: Never Used  . Alcohol Use: Yes     Comment: LAST DRANK-  ON SUN   OB History    Gravida Para Term Preterm AB TAB SAB Ectopic Multiple Living   1 1 1       0 1     Review of Systems  Constitutional: Negative for fever and chills.  Respiratory: Negative for shortness of breath.   Cardiovascular: Negative for chest pain.  Gastrointestinal: Negative for nausea, vomiting, diarrhea and constipation.  Genitourinary: Positive for dysuria and vaginal bleeding.  All other systems reviewed and are  negative.     Allergies  Review of patient's allergies indicates no known allergies.  Home Medications   Prior to Admission medications   Medication Sig Start Date End Date Taking? Authorizing Provider  dextromethorphan-guaiFENesin (MUCINEX DM) 30-600 MG 12hr tablet Take 1 tablet by mouth daily as needed for cough.   Yes Historical Provider, MD  loratadine (CLARITIN) 10 MG tablet Take 10 mg by mouth daily as needed for allergies.   Yes Historical Provider, MD  ibuprofen (ADVIL,MOTRIN) 800 MG tablet Take 1 tablet (800 mg total) by mouth every 8 (eight) hours as needed for moderate pain. Patient not taking: Reported on 03/12/2016 06/15/15   Sherian ReinJody Bovard-Stuckert, MD  metroNIDAZOLE (FLAGYL) 500 MG tablet Take 1 tablet (500 mg total) by mouth 2 (two) times daily. Patient not taking: Reported on 03/12/2016 12/25/15   Marny LowensteinJulie N Wenzel, PA-C  oxyCODONE-acetaminophen (PERCOCET/ROXICET) 5-325 MG per tablet Take 1-2 tablets by mouth every 6 (six) hours as needed for severe pain. Patient not taking: Reported on 03/12/2016 06/15/15   Sherian ReinJody Bovard-Stuckert, MD  phenazopyridine (PYRIDIUM) 200 MG tablet Take 1 tablet (200 mg total) by mouth 3 (three) times daily. Patient not taking: Reported on 03/12/2016 12/29/15   Janne NapoleonHope M Neese, NP  Prenatal Vit-Fe Fumarate-FA (PRENATAL MULTIVITAMIN) TABS tablet Take 1 tablet by mouth daily at 12 noon. Patient not taking: Reported on 03/12/2016 06/15/15   Sherian ReinJody Bovard-Stuckert, MD   BP 108/83 mmHg  Pulse 92  Temp(Src) 98.6 F (37 C) (Oral)  Resp 18  SpO2 100%  Breastfeeding? No Physical Exam  Constitutional: She is oriented to person, place, and time. She appears well-developed and well-nourished.  HENT:  Head: Normocephalic and atraumatic.  Eyes: Conjunctivae and EOM are normal. Pupils are equal, round, and reactive to light.  Neck: Normal range of motion. Neck supple.  Cardiovascular: Normal rate and regular rhythm.  Exam reveals no gallop and no friction rub.   No murmur  heard. Pulmonary/Chest: Effort normal and breath sounds normal. No respiratory distress. She has no wheezes. She has no rales. She exhibits no tenderness.  Abdominal: Soft. Bowel sounds are normal. She exhibits no distension and no mass. There is no tenderness. There is no rebound and no guarding.  No focal abdominal tenderness, no RLQ tenderness or pain at McBurney's point, no RUQ tenderness or Murphy's sign, no left-sided abdominal tenderness, no fluid wave, or signs of peritonitis   Genitourinary:  Pelvic exam chaperoned by female ER tech, no right or left adnexal tenderness, no uterine tenderness, no vaginal discharge, mild bleeding, no hemorrhage or trauma, no CMT or friability, no foreign body, no injury to the external genitalia, no other significant findings   Musculoskeletal: Normal range of motion. She exhibits no edema or tenderness.  Neurological: She is alert and oriented to person, place, and time.  Skin: Skin is warm and dry.  Psychiatric: She has a normal mood and affect. Her behavior is normal. Judgment and thought content normal.  Nursing note and vitals reviewed.   ED Course  Procedures (including critical care time) Results for orders placed or performed during the hospital encounter of 03/12/16  Wet prep, genital  Result Value Ref Range   Yeast Wet Prep HPF POC PRESENT (A) NONE SEEN   Trich, Wet Prep NONE SEEN NONE SEEN   Clue Cells Wet Prep HPF POC NONE SEEN NONE SEEN   WBC, Wet Prep HPF POC NONE SEEN NONE SEEN   Sperm NONE SEEN   Comprehensive metabolic panel  Result Value Ref Range   Sodium 142 135 - 145 mmol/L   Potassium 4.0 3.5 - 5.1 mmol/L   Chloride 110 101 - 111 mmol/L   CO2 22 22 - 32 mmol/L   Glucose, Bld 90 65 - 99 mg/dL   BUN 10 6 - 20 mg/dL   Creatinine, Ser 1.61 0.44 - 1.00 mg/dL   Calcium 9.0 8.9 - 09.6 mg/dL   Total Protein 7.7 6.5 - 8.1 g/dL   Albumin 4.2 3.5 - 5.0 g/dL   AST 21 15 - 41 U/L   ALT 37 14 - 54 U/L   Alkaline Phosphatase 91 38  - 126 U/L   Total Bilirubin 0.4 0.3 - 1.2 mg/dL   GFR calc non Af Amer >60 >60 mL/min   GFR calc Af Amer >60 >60 mL/min   Anion gap 10 5 - 15  CBC  Result Value Ref Range   WBC 6.1 4.0 - 10.5 K/uL   RBC 4.48 3.87 - 5.11 MIL/uL   Hemoglobin 13.8 12.0 - 15.0 g/dL   HCT 04.5 40.9 - 81.1 %   MCV 89.3 78.0 - 100.0 fL   MCH 30.8 26.0 - 34.0 pg   MCHC 34.5 30.0 - 36.0 g/dL   RDW 91.4 78.2 - 95.6 %   Platelets 360 150 - 400 K/uL  Urinalysis, Routine w reflex microscopic (not at [facility])  Result Value Ref Range   Color, Urine BROWN (A) YELLOW   APPearance CLOUDY (A) CLEAR   Specific Gravity,  Urine 1.022 1.005 - 1.030   pH 6.0 5.0 - 8.0   Glucose, UA NEGATIVE NEGATIVE mg/dL   Hgb urine dipstick LARGE (A) NEGATIVE   Bilirubin Urine NEGATIVE NEGATIVE   Ketones, ur NEGATIVE NEGATIVE mg/dL   Protein, ur 30 (A) NEGATIVE mg/dL   Nitrite NEGATIVE NEGATIVE   Leukocytes, UA LARGE (A) NEGATIVE  Urine microscopic-add on  Result Value Ref Range   Squamous Epithelial / LPF NONE SEEN NONE SEEN   WBC, UA TOO NUMEROUS TO COUNT 0 - 5 WBC/hpf   RBC / HPF TOO NUMEROUS TO COUNT 0 - 5 RBC/hpf   Bacteria, UA RARE (A) NONE SEEN  I-Stat beta hCG blood, ED ([facility], [facility], [facility] only)  Result Value Ref Range   I-stat hCG, quantitative <5.0 <5 mIU/mL   Comment 3           No results found.  I have personally reviewed and evaluated these lab results as part of my medical decision-making.   MDM   Final diagnoses:  Dysuria  Abnormal uterine bleeding    Patient with dysuria and vaginal bleeding 5 days. She has an IUD. Will check pregnancy test.  Pregnancy test negative. H&H is stable. No focal abdominal tenderness. Pelvic exam is remarkable for some old blood, but no hemorrhage or trauma. No focal pelvic tenderness. No tenderness on bimanual exam. Urinalysis has some components consistent with infection. Will treat for UTI given symptoms. Recommend follow-up with OB/GYN. I have also ordered a urine culture. Patient  is stable and ready for discharge.    Roxy Horseman, PA-C 03/12/16 1348  Lavera Guise, MD 03/12/16 [phone]

## 2016-03-12 NOTE — ED Notes (Signed)
Urine found in triage box and sent to lab.

## 2016-03-13 LAB — GC/CHLAMYDIA PROBE AMP (~~LOC~~) NOT AT ARMC
CHLAMYDIA, DNA PROBE: NEGATIVE
NEISSERIA GONORRHEA: NEGATIVE

## 2016-03-15 LAB — URINE CULTURE

## 2016-03-16 ENCOUNTER — Telehealth (HOSPITAL_BASED_OUTPATIENT_CLINIC_OR_DEPARTMENT_OTHER): Payer: Self-pay | Admitting: Emergency Medicine

## 2016-03-16 NOTE — Telephone Encounter (Signed)
Post ED Visit - Positive Culture Follow-up  Culture report reviewed by antimicrobial stewardship pharmacist:  []  Enzo BiNathan Batchelder, Pharm.D. []  Celedonio MiyamotoJeremy Frens, Pharm.D., BCPS []  Garvin FilaMike Maccia, Pharm.D. []  Georgina PillionElizabeth Martin, [address]Ph[address]D., BCPS []  RoscoeMinh Pham, [address]Pharm[address]D., BCPS, AAHIVP []  Estella HuskMichelle Turner, Pharm.D., BCPS, AAHIVP []  Tennis Mustassie Stewart, Pharm.D. []  Sherle Poeob Vincent, [address]Lona Kettle. Meredith Tilley PharmD  Positive urine culture Treated with cephalexin, organism sensitive to the same and no further patient follow-up is required at this time.  Berle MullMiller, Sharbel Sahagun 03/16/2016, 9:45 AM

## 2017-05-28 ENCOUNTER — Inpatient Hospital Stay (HOSPITAL_COMMUNITY)
Admission: AD | Admit: 2017-05-28 | Discharge: 2017-05-29 | Disposition: A | Discharge status: Home / Self Care | Payer: 59 | Source: Ambulatory Visit | Attending: Obstetrics and Gynecology | Admitting: Obstetrics and Gynecology

## 2017-05-28 ENCOUNTER — Encounter (HOSPITAL_COMMUNITY): Payer: Self-pay | Admitting: *Deleted

## 2017-05-28 DIAGNOSIS — F17200 Nicotine dependence, unspecified, uncomplicated: Secondary | ICD-10-CM | POA: Insufficient documentation

## 2017-05-28 DIAGNOSIS — N390 Urinary tract infection, site not specified: Secondary | ICD-10-CM | POA: Insufficient documentation

## 2017-05-28 DIAGNOSIS — Z3202 Encounter for pregnancy test, result negative: Secondary | ICD-10-CM | POA: Insufficient documentation

## 2017-05-28 DIAGNOSIS — R300 Dysuria: Secondary | ICD-10-CM | POA: Insufficient documentation

## 2017-05-28 LAB — URINALYSIS, ROUTINE W REFLEX MICROSCOPIC
BILIRUBIN URINE: NEGATIVE
GLUCOSE, UA: NEGATIVE mg/dL
Ketones, ur: NEGATIVE mg/dL
NITRITE: POSITIVE — AB
PH: 6 (ref 5.0–8.0)
Protein, ur: 100 mg/dL — AB
SPECIFIC GRAVITY, URINE: 1.015 (ref 1.005–1.030)

## 2017-05-28 LAB — URINALYSIS, MICROSCOPIC (REFLEX)

## 2017-05-28 LAB — POCT PREGNANCY, URINE: Preg Test, Ur: NEGATIVE

## 2017-05-28 MED ORDER — SULFAMETHOXAZOLE-TRIMETHOPRIM 800-160 MG PO TABS: 1.0000 | ORAL_TABLET | Freq: Two times a day (BID) | ORAL | 0 refills | Status: AC

## 2017-05-28 MED ORDER — PHENAZOPYRIDINE HCL 200 MG PO TABS: 200.0000 mg | ORAL_TABLET | Freq: Three times a day (TID) | ORAL | 0 refills | Status: DC

## 2017-05-28 MED ORDER — PHENAZOPYRIDINE HCL 100 MG PO TABS
200.0000 mg | ORAL_TABLET | Freq: Once | ORAL | Status: AC
  Administered 2017-05-29: 200 mg via ORAL
  Filled 2017-05-28 (×2): qty 2

## 2017-05-28 MED ORDER — SULFAMETHOXAZOLE-TRIMETHOPRIM 800-160 MG PO TABS
1.0000 | ORAL_TABLET | Freq: Once | ORAL | Status: AC
  Administered 2017-05-29: 1 via ORAL
  Filled 2017-05-28: qty 1

## 2017-05-28 NOTE — Discharge Instructions (Signed)

## 2017-05-28 NOTE — MAU Note (Addendum)
Have bad pain mid to lower back that comes around to my sides on both sides. Pain present 2-3 days. Shortness of breath but feels due to pain. Burning with urination.

## 2017-05-28 NOTE — MAU Provider Note (Signed)
History     CSN: [id number]  Arrival date and time: 05/28/17 2128   None     Chief Complaint  Patient presents with  . Back Pain  . Abdominal Pain  . Dysuria   HPI   [name] is a 21 y.o. female G1P1001 here in MAU with dysuria, back pain and lower abdominal pain. The symptoms started 2 days ago. The pain starts in her lower back and radiates around to the front of her lower abdomen. It is a constant pain. No fever.   OB History    Gravida Para Term Preterm AB Living   1 1 1     1    SAB TAB Ectopic Multiple Live Births         0 1      Past Medical History:  Diagnosis Date  . Medical history non-contributory     Past Surgical History:  Procedure Laterality Date  . CESAREAN SECTION N/A 06/13/2015   Procedure: CESAREAN SECTION;  Surgeon: Lavina Hammanodd Meisinger, MD;  Location: [facility];  Service: Obstetrics;  Laterality: N/A;  . NO PAST SURGERIES      Family History  Problem Relation Age of Onset  . Cancer Maternal Grandmother     Social History  Substance Use Topics  . Smoking status: Current Every Day Smoker  . Smokeless tobacco: Never Used  . Alcohol use Yes     Comment: LAST DRANK-  ON SUN    Allergies: No Known Allergies  Prescriptions Prior to Admission  Medication Sig Dispense Refill Last Dose  . cephALEXin (KEFLEX) 250 MG capsule Take 1 capsule (250 mg total) by mouth 4 (four) times daily. 28 capsule 0   . dextromethorphan-guaiFENesin (MUCINEX DM) 30-600 MG 12hr tablet Take 1 tablet by mouth daily as needed for cough.   Past Week at Unknown time  . ibuprofen (ADVIL,MOTRIN) 800 MG tablet Take 1 tablet (800 mg total) by mouth every 8 (eight) hours as needed for moderate pain. (Patient not taking: Reported on 03/12/2016) 45 tablet 1 More than a month at Unknown time  . loratadine (CLARITIN) 10 MG tablet Take 10 mg by mouth daily as needed for allergies.   Past Week at Unknown time  . metroNIDAZOLE (FLAGYL) 500 MG tablet Take 1 tablet (500 mg total) by  mouth 2 (two) times daily. (Patient not taking: Reported on 03/12/2016) 14 tablet 0   . oxyCODONE-acetaminophen (PERCOCET/ROXICET) 5-325 MG per tablet Take 1-2 tablets by mouth every 6 (six) hours as needed for severe pain. (Patient not taking: Reported on 03/12/2016) 40 tablet 0 More than a month at Unknown time  . phenazopyridine (PYRIDIUM) 200 MG tablet Take 1 tablet (200 mg total) by mouth 3 (three) times daily. (Patient not taking: Reported on 03/12/2016) 6 tablet 0   . Prenatal Vit-Fe Fumarate-FA (PRENATAL MULTIVITAMIN) TABS tablet Take 1 tablet by mouth daily at 12 noon. (Patient not taking: Reported on 03/12/2016) 30 tablet 12 More than a month at Unknown time   Results for orders placed or performed during the hospital encounter of 05/28/17 (from the past 48 hour(s))  Urinalysis, Routine w reflex microscopic     Status: Abnormal   Collection Time: 05/28/17 10:17 PM  Result Value Ref Range   Color, Urine YELLOW YELLOW   APPearance CLEAR CLEAR   Specific Gravity, Urine 1.015 1.005 - 1.030   pH 6.0 5.0 - 8.0   Glucose, UA NEGATIVE NEGATIVE mg/dL   Hgb urine dipstick LARGE (A) NEGATIVE   Bilirubin  Urine NEGATIVE NEGATIVE   Ketones, ur NEGATIVE NEGATIVE mg/dL   Protein, ur 161 (A) NEGATIVE mg/dL   Nitrite POSITIVE (A) NEGATIVE   Leukocytes, UA MODERATE (A) NEGATIVE  Urinalysis, Microscopic (reflex)     Status: Abnormal   Collection Time: 05/28/17 10:17 PM  Result Value Ref Range   RBC / HPF TOO NUMEROUS TO COUNT 0 - 5 RBC/hpf   WBC, UA TOO NUMEROUS TO COUNT 0 - 5 WBC/hpf   Bacteria, UA FEW (A) NONE SEEN   Squamous Epithelial / LPF 0-5 (A) NONE SEEN   Urine-Other MUCOUS PRESENT   Pregnancy, urine POC     Status: None   Collection Time: 05/28/17 10:41 PM  Result Value Ref Range   Preg Test, Ur NEGATIVE NEGATIVE    Comment:        THE SENSITIVITY OF THIS METHODOLOGY IS >24 mIU/mL    Review of Systems  Constitutional: Positive for chills. Negative for fever.  Gastrointestinal:  Negative for nausea and vomiting.  Genitourinary: Positive for dysuria, frequency and urgency. Negative for decreased urine volume, flank pain and hematuria.   Physical Exam   Blood pressure 126/73, pulse 86, temperature 99 F (37.2 C), resp. rate 20, height 5\' 2"  (1.575 m), weight 169 lb (76.7 kg), last menstrual period 05/19/2017, SpO2 100 %, not currently breastfeeding.  Physical Exam  Constitutional: She is oriented to person, place, and time. She appears well-developed and well-nourished. No distress.  HENT:  Head: Normocephalic.  Eyes: Pupils are equal, round, and reactive to light.  GI: Normal appearance. There is tenderness in the suprapubic area. There is no CVA tenderness.  Musculoskeletal: Normal range of motion.  Neurological: She is alert and oriented to person, place, and time.  Skin: Skin is warm. She is not diaphoretic.  Psychiatric: Her behavior is normal.    MAU Course  Procedures  None  MDM  UA Bactrim and pyridium given prior to DC.   Assessment and Plan   A:  1. Acute UTI   2. Dysuria     P:  Discharge home in stable condition Rx: Bactrim, pyridium  Return to MAU if symptoms worsen, for emergencies   Stefanny Pieri, Harolyn Rutherford, NP 05/28/2017 11:51 PM

## 2018-06-06 ENCOUNTER — Other Ambulatory Visit: Payer: Self-pay

## 2018-06-06 ENCOUNTER — Encounter (HOSPITAL_BASED_OUTPATIENT_CLINIC_OR_DEPARTMENT_OTHER): Payer: Self-pay | Admitting: *Deleted

## 2018-06-06 ENCOUNTER — Emergency Department (HOSPITAL_BASED_OUTPATIENT_CLINIC_OR_DEPARTMENT_OTHER)
Admission: EM | Admit: 2018-06-06 | Discharge: 2018-06-06 | Disposition: A | Discharge status: Home / Self Care | Payer: Self-pay | Attending: Emergency Medicine | Admitting: Emergency Medicine

## 2018-06-06 DIAGNOSIS — Z5321 Procedure and treatment not carried out due to patient leaving prior to being seen by health care provider: Secondary | ICD-10-CM | POA: Insufficient documentation

## 2018-06-06 DIAGNOSIS — R600 Localized edema: Secondary | ICD-10-CM | POA: Insufficient documentation

## 2018-06-06 NOTE — ED Triage Notes (Signed)
Possible perionychia right index finger. Pain and swelling for several weeks. States she bites her nails. Tightness to her upper lip x 4 days. Started after experimenting with different lip glosses.

## 2018-06-06 NOTE — ED Notes (Signed)
Attempt made to get pt to a room to be seen by provider, not respond gotten.

## 2018-06-06 NOTE — ED Notes (Signed)
Attempted to call x 2 no answer.

## 2018-06-07 NOTE — ED Notes (Signed)
Follow up call made  Has appointment w pcp on Friday   06/07/18  1056  s Tashayla Therien rn

## 2018-07-05 ENCOUNTER — Other Ambulatory Visit: Payer: Self-pay

## 2018-07-05 ENCOUNTER — Encounter (HOSPITAL_BASED_OUTPATIENT_CLINIC_OR_DEPARTMENT_OTHER): Payer: Self-pay | Admitting: *Deleted

## 2018-07-05 ENCOUNTER — Emergency Department (HOSPITAL_BASED_OUTPATIENT_CLINIC_OR_DEPARTMENT_OTHER)
Admission: EM | Admit: 2018-07-05 | Discharge: 2018-07-06 | Disposition: A | Discharge status: Home / Self Care | Payer: 59 | Attending: Emergency Medicine | Admitting: Emergency Medicine

## 2018-07-05 DIAGNOSIS — Z79899 Other long term (current) drug therapy: Secondary | ICD-10-CM | POA: Insufficient documentation

## 2018-07-05 DIAGNOSIS — Z3201 Encounter for pregnancy test, result positive: Secondary | ICD-10-CM | POA: Diagnosis not present

## 2018-07-05 DIAGNOSIS — O26891 Other specified pregnancy related conditions, first trimester: Secondary | ICD-10-CM | POA: Insufficient documentation

## 2018-07-05 DIAGNOSIS — O99331 Smoking (tobacco) complicating pregnancy, first trimester: Secondary | ICD-10-CM | POA: Insufficient documentation

## 2018-07-05 DIAGNOSIS — R030 Elevated blood-pressure reading, without diagnosis of hypertension: Secondary | ICD-10-CM | POA: Diagnosis not present

## 2018-07-05 DIAGNOSIS — N912 Amenorrhea, unspecified: Secondary | ICD-10-CM | POA: Insufficient documentation

## 2018-07-05 DIAGNOSIS — Z3A01 Less than 8 weeks gestation of pregnancy: Secondary | ICD-10-CM | POA: Diagnosis not present

## 2018-07-05 DIAGNOSIS — F17200 Nicotine dependence, unspecified, uncomplicated: Secondary | ICD-10-CM | POA: Insufficient documentation

## 2018-07-05 DIAGNOSIS — O9989 Other specified diseases and conditions complicating pregnancy, childbirth and the puerperium: Secondary | ICD-10-CM | POA: Insufficient documentation

## 2018-07-05 NOTE — ED Triage Notes (Signed)
Pt reports 2 positive pregnancy test at home. Reports that she is here tonight to confirm pregnancy. Denies any pain, vaginal discharge. Reports breast tenderness.  LMP 07.5.19

## 2018-07-06 LAB — PREGNANCY, URINE: PREG TEST UR: POSITIVE — AB

## 2018-07-06 LAB — URINALYSIS, ROUTINE W REFLEX MICROSCOPIC
BILIRUBIN URINE: NEGATIVE
Glucose, UA: NEGATIVE mg/dL
Hgb urine dipstick: NEGATIVE
Ketones, ur: NEGATIVE mg/dL
Leukocytes, UA: NEGATIVE
NITRITE: NEGATIVE
PROTEIN: NEGATIVE mg/dL
Specific Gravity, Urine: 1.015 (ref 1.005–1.030)
pH: 6.5 (ref 5.0–8.0)

## 2018-07-06 MED ORDER — PRENATAL VITAMINS 28-0.8 MG PO TABS: 1.0000 | ORAL_TABLET | Freq: Every day | ORAL | 2 refills | Status: DC

## 2018-07-06 NOTE — ED Notes (Signed)
Pt. Here for pregnancy test due to wants a professional test after having 2 positive pregnancy test at home.  Pt. Has had 2 positive urine pregnancy test at home this week.

## 2018-07-06 NOTE — Discharge Instructions (Signed)
Please see the information and instructions below regarding your visit.  Your diagnoses today include:  1. Less than [redacted] weeks gestation of pregnancy   2. Elevated blood pressure reading without diagnosis of hypertension    Your blood pressure is slightly elevated today. This just needs to be repeated by your OBGYN at first prenatal appointment.  Tests performed today include: See side panel of your discharge paperwork for testing performed today. Vital signs are listed at the bottom of these instructions.   Medications prescribed:    Take any prescribed medications only as prescribed, and any over the counter medications only as directed on the packaging.  Please take the prenatal vitamin that I prescribed you daily.  For pain and discomfort, the only safe medication in pregnancy is Tylenol.  Home care instructions:  Please follow any educational materials contained in this packet.   Follow-up instructions: Please call your OB/GYN tomorrow to see when they would like to see you for your initial prenatal appointment.  Return instructions:  Please return to the Emergency Department if you experience worsening symptoms.  Please return the emergency department if you experience any vaginal bleeding, abdominal pain or cramping, nausea or vomiting that prevents you from keeping anything down. Please return if you have any other emergent concerns.  Additional Information:  Your vital signs today were: BP (!) 127/92 (BP Location: Left Arm)    Pulse 75    Temp 99.1 F (37.3 C) (Oral)    Resp 18    Ht 5\' 2"  (1.575 m)    Wt 74.8 kg    LMP 05/27/2018    SpO2 100%    BMI 30.18 kg/m  If your blood pressure (BP) was elevated on multiple readings during this visit above 130 for the top number or above 80 for the bottom number, please have this repeated by your primary care provider within one month. --------------  Thank you for allowing us to participate in your care today.

## 2018-07-06 NOTE — ED Provider Notes (Signed)
[facility] EMERGENCY DEPARTMENT Provider Note   CSN: [id number] Arrival date & time: 07/05/18  2255     History   Chief Complaint Chief Complaint  Patient presents with  . Possible Pregnancy    HPI Krystal Todd is a 22 y.o. female.  HPI   Patient is a 22 year old female with no significant past medical history presenting for possible pregnancy.  Patient reports that she was several days late on her menstrual cycle, and took 2 pregnancy test today which were positive.  Patient reports that she has had bilateral breast pain and swelling, but denies any abdominal pain, nausea, vomiting, pelvic pain, or vaginal bleeding.  Patient does note that she was treated 2 weeks ago for urinary tract infection, however dysuria, urgency and frequency have resolved.  Patient has 1 child, one by C-section, and otherwise uncomplicated pregnancy.  No past medical history or chronic medications are present.  Past Medical History:  Diagnosis Date  . Medical history non-contributory     Patient Active Problem List   Diagnosis Date Noted  . Normal labor 06/13/2015    Past Surgical History:  Procedure Laterality Date  . CESAREAN SECTION N/A 06/13/2015   Procedure: CESAREAN SECTION;  Surgeon: Lavina Hammanodd Meisinger, MD;  Location: [facility];  Service: Obstetrics;  Laterality: N/A;  . NO PAST SURGERIES       OB History    Gravida  2   Para  1   Term  1   Preterm      AB      Living  1     SAB      TAB      Ectopic      Multiple  0   Live Births  1            Home Medications    Prior to Admission medications   Medication Sig Start Date End Date Taking? Authorizing Provider  phenazopyridine (PYRIDIUM) 200 MG tablet Take 1 tablet (200 mg total) by mouth 3 (three) times daily. 05/28/17   Rasch, Harolyn RutherfordJennifer I, NP    Family History Family History  Problem Relation Age of Onset  . Cancer Maternal Grandmother     Social History Social History   Tobacco Use  .  Smoking status: Current Every Day Smoker  . Smokeless tobacco: Never Used  Substance Use Topics  . Alcohol use: Yes    Comment: LAST DRANK-  ON SUN  . Drug use: No     Allergies   Patient has no known allergies.   Review of Systems Review of Systems  Constitutional: Negative for chills and fever.  Gastrointestinal: Negative for abdominal pain, nausea and vomiting.  Genitourinary: Negative for dysuria, frequency, urgency, vaginal bleeding and vaginal discharge.  Skin:       +Breast pain     Physical Exam Updated Vital Signs BP (!) 127/92 (BP Location: Left Arm)   Pulse 75   Temp 99.1 F (37.3 C) (Oral)   Resp 18   Ht 5\' 2"  (1.575 m)   Wt 74.8 kg   LMP 05/27/2018   SpO2 100%   BMI 30.18 kg/m   Physical Exam  Constitutional: She appears well-developed and well-nourished. No distress.  Sitting comfortably in bed.  HENT:  Head: Normocephalic and atraumatic.  Eyes: Conjunctivae are normal. Right eye exhibits no discharge. Left eye exhibits no discharge.  EOMs normal to gross examination.  Neck: Normal range of motion.  Cardiovascular: Normal rate, regular rhythm and normal  heart sounds.  Pulmonary/Chest: Effort normal. She has no wheezes. She has no rales.  Normal respiratory effort. Patient converses comfortably. No audible wheeze or stridor.  Abdominal: Soft. She exhibits no distension. There is no tenderness. There is no guarding.  Musculoskeletal: Normal range of motion.  Neurological: She is alert.  Cranial nerves intact to gross observation. Patient moves extremities without difficulty.  Skin: Skin is warm and dry. She is not diaphoretic.  Psychiatric: She has a normal mood and affect. Her behavior is normal. Judgment and thought content normal.  Nursing note and vitals reviewed.    ED Treatments / Results  Labs (all labs ordered are listed, but only abnormal results are displayed) Labs Reviewed  PREGNANCY, URINE - Abnormal; Notable for the following  components:      Result Value   Preg Test, Ur POSITIVE (*)    All other components within normal limits  URINALYSIS, ROUTINE W REFLEX MICROSCOPIC    EKG None  Radiology No results found.  Procedures Procedures (including critical care time)  Medications Ordered in ED Medications - No data to display   Initial Impression / Assessment and Plan / ED Course  I have reviewed the triage vital signs and the nursing notes.  Pertinent labs & imaging results that were available during my care of the patient were reviewed by me and considered in my medical decision making (see chart for details).     Patient is well-appearing and in no acute distress.  Benign abdomen.  Urinalysis without signs of infection or asymptomatic bacteriuria.  Patient has positive pregnancy test today in emergency department.  Will initiate prenatal vitamin.  I gave patient counseling of prenatal care including proper medication usage, nutrition, and encourage patient to call her OB/GYN tomorrow to receive further information and establish her first prenatal visit.  Return precautions were given for any vaginal bleeding, pelvic pain, abdominal pain, intractable nausea or vomiting.  Patient and family are in understanding and agree with plan of care.  Final Clinical Impressions(s) / ED Diagnoses   Final diagnoses:  Less than [redacted] weeks gestation of pregnancy  Elevated blood pressure reading without diagnosis of hypertension    ED Discharge Orders         Ordered    Prenatal Vit-Fe Fumarate-FA (PRENATAL VITAMINS) 28-0.8 MG TABS  Daily     07/06/18 0041           Elisha PonderMurray, Lonzie Simmer B, PA-C 07/06/18 0041    Terrilee FilesButler, Michael C, MD 07/06/18 1159

## 2018-08-01 LAB — OB RESULTS CONSOLE RUBELLA ANTIBODY, IGM: Rubella: IMMUNE

## 2018-08-01 LAB — OB RESULTS CONSOLE GC/CHLAMYDIA
Chlamydia: NEGATIVE
Gonorrhea: NEGATIVE

## 2018-08-01 LAB — OB RESULTS CONSOLE ABO/RH: RH Type: POSITIVE

## 2018-08-01 LAB — OB RESULTS CONSOLE HEPATITIS B SURFACE ANTIGEN: Hepatitis B Surface Ag: NEGATIVE

## 2018-08-01 LAB — OB RESULTS CONSOLE HIV ANTIBODY (ROUTINE TESTING): HIV: NONREACTIVE

## 2018-08-01 LAB — OB RESULTS CONSOLE RPR: RPR: NONREACTIVE

## 2018-08-01 LAB — OB RESULTS CONSOLE ANTIBODY SCREEN: Antibody Screen: NEGATIVE

## 2018-11-23 NOTE — L&D Delivery Note (Addendum)
Operative Delivery Note At 7:52 AM a viable female was delivered via VBAC, Vacuum Assisted.  Presentation: vertex; Position: Left,, Occiput,, Posterior; Station: +3.  Verbal consent: obtained from patient.  Risks and benefits discussed in detail.  Risks include, but are not limited to the risks of anesthesia, bleeding, infection, damage to maternal tissues, fetal cephalhematoma.  There is also the risk of inability to effect vaginal delivery of the head, or shoulder dystocia that cannot be resolved by established maneuvers, leading to the need for emergency cesarean section.  Vacuum assistance recommended due to prolonged FHR decels after pushing.  Vacuum applied and she delivered with one ctx with vacuum in green zone  APGAR: 8, 9; weight pending.   Placenta status: spontaneous, intact.   Cord:  with the following complications: none.   Anesthesia:  Epidural Instruments: Kiwi Episiotomy: None Lacerations: 2nd degree;Perineal Suture Repair: 3.0 vicryl rapide Est. Blood Loss (mL):  200  Mom to postpartum.  Baby to Couplet care / Skin to Skin. She had one temp to 101 about 30 minutes prior to delivery.  She will get one dose of Unasyn and monitor temp, placenta for pathology.  Leighton Roach Victor Granados 03/04/2019, 8:40 AM

## 2018-12-19 ENCOUNTER — Encounter (HOSPITAL_COMMUNITY): Payer: Self-pay | Admitting: *Deleted

## 2018-12-19 ENCOUNTER — Inpatient Hospital Stay (HOSPITAL_COMMUNITY)
Admission: AD | Admit: 2018-12-19 | Discharge: 2018-12-19 | Disposition: A | Discharge status: Home / Self Care | Payer: Medicaid Other | Source: Ambulatory Visit | Attending: Obstetrics and Gynecology | Admitting: Obstetrics and Gynecology

## 2018-12-19 DIAGNOSIS — O99333 Smoking (tobacco) complicating pregnancy, third trimester: Secondary | ICD-10-CM | POA: Diagnosis not present

## 2018-12-19 DIAGNOSIS — K529 Noninfective gastroenteritis and colitis, unspecified: Secondary | ICD-10-CM | POA: Diagnosis not present

## 2018-12-19 DIAGNOSIS — Z3A29 29 weeks gestation of pregnancy: Secondary | ICD-10-CM | POA: Insufficient documentation

## 2018-12-19 DIAGNOSIS — F17200 Nicotine dependence, unspecified, uncomplicated: Secondary | ICD-10-CM | POA: Diagnosis not present

## 2018-12-19 DIAGNOSIS — Z3689 Encounter for other specified antenatal screening: Secondary | ICD-10-CM

## 2018-12-19 DIAGNOSIS — O26893 Other specified pregnancy related conditions, third trimester: Secondary | ICD-10-CM

## 2018-12-19 DIAGNOSIS — O99613 Diseases of the digestive system complicating pregnancy, third trimester: Secondary | ICD-10-CM | POA: Insufficient documentation

## 2018-12-19 DIAGNOSIS — R109 Unspecified abdominal pain: Secondary | ICD-10-CM | POA: Diagnosis present

## 2018-12-19 HISTORY — DX: Chronic kidney disease, unspecified: N18.9

## 2018-12-19 LAB — URINALYSIS, ROUTINE W REFLEX MICROSCOPIC
BILIRUBIN URINE: NEGATIVE
Glucose, UA: NEGATIVE mg/dL
Hgb urine dipstick: NEGATIVE
Ketones, ur: NEGATIVE mg/dL
LEUKOCYTES UA: NEGATIVE
NITRITE: NEGATIVE
PH: 7 (ref 5.0–8.0)
Protein, ur: NEGATIVE mg/dL
SPECIFIC GRAVITY, URINE: 1.013 (ref 1.005–1.030)

## 2018-12-19 MED ORDER — ONDANSETRON 4 MG PO TBDP: 4.0000 mg | ORAL_TABLET | Freq: Three times a day (TID) | ORAL | 0 refills | Status: DC | PRN

## 2018-12-19 MED ORDER — PROMETHAZINE HCL 25 MG/ML IJ SOLN
25.0000 mg | Freq: Once | INTRAMUSCULAR | Status: AC
  Administered 2018-12-19: 25 mg via INTRAMUSCULAR
  Filled 2018-12-19: qty 1

## 2018-12-19 MED ORDER — PROMETHAZINE HCL 25 MG/ML IJ SOLN: 25.0000 mg | Freq: Once | INTRAMUSCULAR | Status: DC

## 2018-12-19 NOTE — MAU Provider Note (Addendum)
History     CSN: [id number]  Arrival date and time: 12/19/18 0786   First Provider Initiated Contact with Patient 12/19/18 2004      Chief Complaint  Patient presents with  . Abdominal Pain   HPI  Ms.  Krystal Todd is a 23 y.o. year old G18P1001 female at [redacted]w[redacted]d weeks gestation who presents to MAU reporting N/V/D since 0300 that stopped around 1500 today. She still reports feeling nauseated and has lower abdominal pain/cramping. She has only been able to eat Goldfish snacks and water since 1500. She reports (+) FM.   Past Medical History:  Diagnosis Date  . Chronic kidney disease    frequent UTI's  . Medical history non-contributory     Past Surgical History:  Procedure Laterality Date  . CESAREAN SECTION N/A 06/13/2015   Procedure: CESAREAN SECTION;  Surgeon: Lavina Hamman, MD;  Location: [facility];  Service: Obstetrics;  Laterality: N/A;  . NO PAST SURGERIES      Family History  Problem Relation Age of Onset  . Cancer Maternal Grandmother     Social History   Tobacco Use  . Smoking status: Current Every Day Smoker  . Smokeless tobacco: Never Used  Substance Use Topics  . Alcohol use: Not Currently    Comment: LAST DRANK-  ON SUN  . Drug use: No    Allergies: No Known Allergies  Medications Prior to Admission  Medication Sig Dispense Refill Last Dose  . phenazopyridine (PYRIDIUM) 200 MG tablet Take 1 tablet (200 mg total) by mouth 3 (three) times daily. 6 tablet 0   . Prenatal Vit-Fe Fumarate-FA (PRENATAL VITAMINS) 28-0.8 MG TABS Take 1 tablet by mouth daily. 30 tablet 2     Review of Systems  Constitutional: Negative.   HENT: Negative.   Eyes: Negative.   Respiratory: Negative.   Cardiovascular: Negative.   Gastrointestinal: Positive for diarrhea, nausea and vomiting (started at 0300; stopped at 1500).  Endocrine: Negative.   Genitourinary: Negative.   Musculoskeletal: Negative.   Skin: Negative.   Allergic/Immunologic: Negative.   Neurological:  Negative.   Hematological: Negative.   Psychiatric/Behavioral: Negative.    Physical Exam   Blood pressure 118/63, pulse 87, temperature 98.1 F (36.7 C), temperature source Oral, resp. rate 16, height 5\' 2"  (1.575 m), weight 76.7 kg, last menstrual period 05/27/2018, SpO2 100 %.  Physical Exam  Nursing note and vitals reviewed. Constitutional: She is oriented to person, place, and time. She appears well-developed and well-nourished.  HENT:  Head: Normocephalic and atraumatic.  Eyes: Pupils are equal, round, and reactive to light.  Neck: Normal range of motion.  Cardiovascular: Normal rate.  Respiratory: Effort normal.  GI: Soft. Bowel sounds are decreased.  Genitourinary:    Genitourinary Comments: Dilation: Closed Effacement (%): Thick Exam by: Carloyn Jaeger CNM    Musculoskeletal: Normal range of motion.  Neurological: She is alert and oriented to person, place, and time.  Skin: Skin is warm and dry.  Psychiatric: She has a normal mood and affect. Her behavior is normal. Judgment and thought content normal.    MAU Course  Procedures  MDM CCUA Phenergan 25 mg IM NST - FHR: 125 bpm / moderate variability / accels present / decels absent / TOCO: regular every 4-6 mins   Results for orders placed or performed during the hospital encounter of 12/19/18 (from the past 24 hour(s))  Urinalysis, Routine w reflex microscopic     Status: None   Collection Time: 12/19/18  7:30 PM  Result Value Ref Range   Color, Urine YELLOW YELLOW   APPearance CLEAR CLEAR   Specific Gravity, Urine 1.013 1.005 - 1.030   pH 7.0 5.0 - 8.0   Glucose, UA NEGATIVE NEGATIVE mg/dL   Hgb urine dipstick NEGATIVE NEGATIVE   Bilirubin Urine NEGATIVE NEGATIVE   Ketones, ur NEGATIVE NEGATIVE mg/dL   Protein, ur NEGATIVE NEGATIVE mg/dL   Nitrite NEGATIVE NEGATIVE   Leukocytes, UA NEGATIVE NEGATIVE     Report given to and care assumed by Steward Drone, CNM @ 2020  Raelyn Mora, MSN, CNM 12/19/2018,  8:04 PM   Treatments in MAU included 25mg  IM phenergan. Patient reports nausea is completely relieved and no acts of emesis while in MAU. Rx for Zofran for home use. Discussed reasons to return to MAU and to follow up as scheduled for prenatal appointments. Pt stable at time of discharge.  Assessment and Plan   1. Gastroenteritis, acute   2. [redacted] weeks gestation of pregnancy   3. NST (non-stress test) reactive    Discharge home  Follow up as scheduled for prenatal appointments  Return to MAU as needed Rx for Zofran   Follow-up Information    [facility] Follow up.   Why:  Follow up as scheduled for prenatal appointments  Contact information: [address]  [address] [place] [address] [phone]          Sharyon Cable, [place] 12/19/18, 9:26 PM

## 2018-12-19 NOTE — MAU Note (Signed)
Pt reports vomiting and diarrhea since 3am, both have stopped now but sh is having lower abd cramping.

## 2019-02-08 LAB — OB RESULTS CONSOLE GBS: GBS: NEGATIVE

## 2019-02-24 ENCOUNTER — Encounter (HOSPITAL_COMMUNITY): Payer: Self-pay

## 2019-02-24 NOTE — Patient Instructions (Signed)
Krystal Todd  02/24/2019   Your procedure is scheduled on:  03/04/2019  Arrive at 0700 at Entrance C on [address] at [facility] and [employer]. You are invited to use the FREE valet parking or use the Visitor's parking deck.  Pick up the phone at the desk and dial [phone].  Call this number if you have problems the morning of surgery: [phone]  Remember:   Do not eat food:(After Midnight) Desps de medianoche.  Do not drink clear liquids: (After Midnight) Desps de medianoche.  Take these medicines the morning of surgery with A SIP OF WATER:  none   Do not wear jewelry, make-up or nail polish.  Do not wear lotions, powders, or perfumes. Do not wear deodorant.  Do not shave 48 hours prior to surgery.  Do not bring valuables to the hospital.  [facility] is not   responsible for any belongings or valuables brought to the hospital.  Contacts, dentures or bridgework may not be worn into surgery.  Leave suitcase in the car. After surgery it may be brought to your room.  For patients admitted to the hospital, checkout time is 11:00 AM the day of              discharge.      Please read over the following fact sheets that you were given:     Preparing for Surgery

## 2019-03-03 ENCOUNTER — Other Ambulatory Visit: Payer: Self-pay

## 2019-03-03 ENCOUNTER — Inpatient Hospital Stay (HOSPITAL_COMMUNITY): Payer: Medicaid Other | Admitting: Anesthesiology

## 2019-03-03 ENCOUNTER — Inpatient Hospital Stay (HOSPITAL_COMMUNITY)
Admission: AD | Admit: 2019-03-03 | Discharge: 2019-03-06 | DRG: 807 | Disposition: A | Discharge status: Home / Self Care | Payer: Medicaid Other | Attending: Obstetrics and Gynecology | Admitting: Obstetrics and Gynecology

## 2019-03-03 ENCOUNTER — Encounter (HOSPITAL_COMMUNITY): Payer: Self-pay | Admitting: *Deleted

## 2019-03-03 ENCOUNTER — Encounter (HOSPITAL_COMMUNITY)
Admission: RE | Admit: 2019-03-03 | Discharge: 2019-03-03 | Disposition: A | Discharge status: Home / Self Care | Payer: Medicaid Other | Source: Ambulatory Visit

## 2019-03-03 DIAGNOSIS — O34211 Maternal care for low transverse scar from previous cesarean delivery: Principal | ICD-10-CM | POA: Diagnosis present

## 2019-03-03 DIAGNOSIS — Z3A40 40 weeks gestation of pregnancy: Secondary | ICD-10-CM

## 2019-03-03 DIAGNOSIS — Z87891 Personal history of nicotine dependence: Secondary | ICD-10-CM | POA: Diagnosis not present

## 2019-03-03 DIAGNOSIS — O34219 Maternal care for unspecified type scar from previous cesarean delivery: Secondary | ICD-10-CM | POA: Diagnosis not present

## 2019-03-03 LAB — CBC
HCT: 38.3 % (ref 36.0–46.0)
Hemoglobin: 12.5 g/dL (ref 12.0–15.0)
MCH: 30 pg (ref 26.0–34.0)
MCHC: 32.6 g/dL (ref 30.0–36.0)
MCV: 92.1 fL (ref 80.0–100.0)
Platelets: 171 10*3/uL (ref 150–400)
RBC: 4.16 MIL/uL (ref 3.87–5.11)
RDW: 13.5 % (ref 11.5–15.5)
WBC: 7.5 10*3/uL (ref 4.0–10.5)
nRBC: 0 % (ref 0.0–0.2)

## 2019-03-03 LAB — TYPE AND SCREEN
ABO/RH(D): A POS
Antibody Screen: NEGATIVE

## 2019-03-03 LAB — ABO/RH: ABO/RH(D): A POS

## 2019-03-03 MED ORDER — LIDOCAINE HCL (PF) 1 % IJ SOLN
30.0000 mL | INTRAMUSCULAR | Status: DC | PRN
  Filled 2019-03-03: qty 30

## 2019-03-03 MED ORDER — OXYTOCIN 40 UNITS IN NORMAL SALINE INFUSION - SIMPLE MED
1.0000 m[IU]/min | INTRAVENOUS | Status: DC
  Administered 2019-03-03: 11:00:00 2 m[IU]/min via INTRAVENOUS
  Filled 2019-03-03: qty 1000

## 2019-03-03 MED ORDER — ACETAMINOPHEN 325 MG PO TABS
650.0000 mg | ORAL_TABLET | ORAL | Status: DC | PRN
  Administered 2019-03-04 (×2): 650 mg via ORAL
  Filled 2019-03-03 (×2): qty 2

## 2019-03-03 MED ORDER — FENTANYL-BUPIVACAINE-NACL 0.5-0.125-0.9 MG/250ML-% EP SOLN
12.0000 mL/h | EPIDURAL | Status: DC | PRN
  Filled 2019-03-03: qty 250

## 2019-03-03 MED ORDER — OXYTOCIN BOLUS FROM INFUSION
500.0000 mL | Freq: Once | INTRAVENOUS | Status: AC
  Administered 2019-03-04: 500 mL via INTRAVENOUS

## 2019-03-03 MED ORDER — SOD CITRATE-CITRIC ACID 500-334 MG/5ML PO SOLN: 30.0000 mL | ORAL | Status: DC | PRN

## 2019-03-03 MED ORDER — EPHEDRINE 5 MG/ML INJ: 10.0000 mg | INTRAVENOUS | Status: DC | PRN

## 2019-03-03 MED ORDER — OXYTOCIN 40 UNITS IN NORMAL SALINE INFUSION - SIMPLE MED
2.5000 [IU]/h | INTRAVENOUS | Status: DC
  Administered 2019-03-04: 2.5 [IU]/h via INTRAVENOUS

## 2019-03-03 MED ORDER — SODIUM CHLORIDE (PF) 0.9 % IJ SOLN
INTRAMUSCULAR | Status: DC | PRN
  Administered 2019-03-03: 14 mL/h via EPIDURAL

## 2019-03-03 MED ORDER — LACTATED RINGERS IV SOLN
500.0000 mL | INTRAVENOUS | Status: DC | PRN
  Administered 2019-03-03: 500 mL via INTRAVENOUS

## 2019-03-03 MED ORDER — BUTORPHANOL TARTRATE 1 MG/ML IJ SOLN
1.0000 mg | INTRAMUSCULAR | Status: DC | PRN
  Administered 2019-03-03: 15:00:00 1 mg via INTRAVENOUS
  Filled 2019-03-03: qty 1

## 2019-03-03 MED ORDER — LACTATED RINGERS IV SOLN: 500.0000 mL | Freq: Once | INTRAVENOUS | Status: DC

## 2019-03-03 MED ORDER — PHENYLEPHRINE 40 MCG/ML (10ML) SYRINGE FOR IV PUSH (FOR BLOOD PRESSURE SUPPORT)
80.0000 ug | PREFILLED_SYRINGE | INTRAVENOUS | Status: DC | PRN
  Administered 2019-03-03: 19:00:00 80 ug via INTRAVENOUS
  Filled 2019-03-03: qty 10

## 2019-03-03 MED ORDER — OXYCODONE-ACETAMINOPHEN 5-325 MG PO TABS: 2.0000 | ORAL_TABLET | ORAL | Status: DC | PRN

## 2019-03-03 MED ORDER — TERBUTALINE SULFATE 1 MG/ML IJ SOLN: 0.2500 mg | Freq: Once | INTRAMUSCULAR | Status: DC | PRN

## 2019-03-03 MED ORDER — PHENYLEPHRINE 40 MCG/ML (10ML) SYRINGE FOR IV PUSH (FOR BLOOD PRESSURE SUPPORT)
80.0000 ug | PREFILLED_SYRINGE | INTRAVENOUS | Status: AC | PRN
  Administered 2019-03-03 (×3): 80 ug via INTRAVENOUS

## 2019-03-03 MED ORDER — ONDANSETRON HCL 4 MG/2ML IJ SOLN
4.0000 mg | Freq: Four times a day (QID) | INTRAMUSCULAR | Status: DC | PRN
  Administered 2019-03-03: 23:00:00 4 mg via INTRAVENOUS
  Filled 2019-03-03: qty 2

## 2019-03-03 MED ORDER — LIDOCAINE-EPINEPHRINE (PF) 2 %-1:200000 IJ SOLN
INTRAMUSCULAR | Status: DC | PRN
  Administered 2019-03-03: 5 mL via EPIDURAL
  Administered 2019-03-03: 2 mL via EPIDURAL

## 2019-03-03 MED ORDER — DIPHENHYDRAMINE HCL 50 MG/ML IJ SOLN: 12.5000 mg | INTRAMUSCULAR | Status: DC | PRN

## 2019-03-03 MED ORDER — OXYCODONE-ACETAMINOPHEN 5-325 MG PO TABS: 1.0000 | ORAL_TABLET | ORAL | Status: DC | PRN

## 2019-03-03 MED ORDER — LACTATED RINGERS IV SOLN
INTRAVENOUS | Status: DC
  Administered 2019-03-03 – 2019-03-04 (×3): via INTRAVENOUS

## 2019-03-03 NOTE — H&P (Signed)
Krystal Todd is a 23 y.o. female, G3 P1011, EGA [redacted] weeks with [facility] today presenting for elective induction.  Previous LTCS with 2 layer closure for fetal intolerance of labor, prenatal care uncomplicated.  OB History    Gravida  3   Para  1   Term  1   Preterm      AB  1   Living  1     SAB  1   TAB      Ectopic      Multiple  0   Live Births  1          Past Medical History:  Diagnosis Date  . Chronic kidney disease    frequent UTI's  . Medical history non-contributory    Past Surgical History:  Procedure Laterality Date  . CESAREAN SECTION N/A 06/13/2015   Procedure: CESAREAN SECTION;  Surgeon: Lavina Hamman, MD;  Location: [facility];  Service: Obstetrics;  Laterality: N/A;  . NO PAST SURGERIES     Family History: family history includes Cancer in her maternal grandmother. Social History:  reports that she quit smoking about 3 years ago. She has never used smokeless tobacco. She reports previous alcohol use. She reports that she does not use drugs.     Maternal Diabetes: No Genetic Screening: Declined Maternal Ultrasounds/Referrals: Normal Fetal Ultrasounds or other Referrals:  None Maternal Substance Abuse:  No Significant Maternal Medications:  None Significant Maternal Lab Results:  Lab values include: Group B Strep negative Other Comments:  None  Review of Systems  Respiratory: Negative.   Cardiovascular: Negative.    Maternal Medical History:  Contractions: Frequency: irregular.   Perceived severity is mild.    Fetal activity: Perceived fetal activity is normal.    Prenatal complications: no prenatal complications Prenatal Complications - Diabetes: none.   Current VE 3/50/-2, vtx, attempted AROM-no fluid Dilation: 1.5 Effacement (%): 50 Station: -3 Exam by:: h koran rnc  Blood pressure 95/61, pulse 78, temperature 98.3 F (36.8 C), temperature source Oral, resp. rate 16, height 5\' 2"  (1.575 m), weight 85.7 kg, last menstrual period  05/27/2018. Maternal Exam:  Uterine Assessment: Contraction strength is mild.  Contraction frequency is irregular.   Abdomen: Patient reports no abdominal tenderness. Surgical scars: low transverse.   Estimated fetal weight is 7.5 lbs.   Fetal presentation: vertex  Introitus: Normal vulva. Normal vagina.  Amniotic fluid character: not assessed.  Pelvis: adequate for delivery.   Cervix: Cervix evaluated by digital exam.     Fetal Exam Fetal Monitor Review: Mode: ultrasound.   Baseline rate: 110-120.  Variability: moderate (6-25 bpm).   Pattern: accelerations present and no decelerations.    Fetal State Assessment: Category I - tracings are normal.     Physical Exam  Vitals reviewed. Constitutional: She appears well-developed and well-nourished.  Cardiovascular: Normal rate and regular rhythm.  Respiratory: Effort normal. No respiratory distress.  GI: Soft.  Genitourinary:    Vulva normal.     Prenatal labs: ABO, Rh: --/--/A POS (04/10 1014) Antibody: NEG (04/10 1014) Rubella: Immune (09/09 0000) RPR: Nonreactive (09/09 0000)  HBsAg: Negative (09/09 0000)  HIV: Non-reactive (09/09 0000)  GBS: Negative (03/18 0000)   Assessment/Plan: IUP at 40 weeks, previous LTCS, desires VBAC.  On pitocin, risks previously discussed.  Will continue pitocin, monitor progress.   Krystal Todd Krystal Todd 03/03/2019, 1:54 PM

## 2019-03-03 NOTE — Anesthesia Procedure Notes (Signed)
Epidural Patient location during procedure: OB Start time: 03/03/2019 5:00 PM End time: 03/03/2019 5:14 PM  Staffing Anesthesiologist: Lucretia Kern, MD Performed: anesthesiologist   Preanesthetic Checklist Completed: patient identified, pre-op evaluation, timeout performed, IV checked, risks and benefits discussed and monitors and equipment checked  Epidural Patient position: sitting Prep: DuraPrep Patient monitoring: heart rate, continuous pulse ox and blood pressure Approach: midline Location: L3-L4 Injection technique: LOR air  Needle:  Needle type: Tuohy  Needle gauge: 17 G Needle length: 9 cm Needle insertion depth: 5 cm Catheter type: closed end flexible Catheter size: 19 Gauge Catheter at skin depth: 10 cm Test dose: negative and 2% lidocaine with Epi 1:200 K  Assessment Events: blood not aspirated, injection not painful, no injection resistance, negative IV test and no paresthesia  Additional Notes Reason for block:procedure for pain

## 2019-03-03 NOTE — Anesthesia Preprocedure Evaluation (Addendum)
Anesthesia Evaluation  Patient identified by MRN, date of birth, ID band Patient awake    Reviewed: Allergy & Precautions, H&P , NPO status , Patient's Chart, lab work & pertinent test results  History of Anesthesia Complications Negative for: history of anesthetic complications  Airway Mallampati: II  TM Distance: >3 FB Neck ROM: full    Dental no notable dental hx.    Pulmonary neg pulmonary ROS, former smoker,    Pulmonary exam normal        Cardiovascular negative cardio ROS Normal cardiovascular exam Rhythm:regular Rate:Normal     Neuro/Psych negative neurological ROS  negative psych ROS   GI/Hepatic negative GI ROS, Neg liver ROS,   Endo/Other  negative endocrine ROS  Renal/GU negative Renal ROS  negative genitourinary   Musculoskeletal   Abdominal   Peds  Hematology negative hematology ROS (+)   Anesthesia Other Findings   Reproductive/Obstetrics (+) Pregnancy                             Anesthesia Physical Anesthesia Plan  ASA: II  Anesthesia Plan: Epidural   Post-op Pain Management:    Induction:   PONV Risk Score and Plan:   Airway Management Planned:   Additional Equipment:   Intra-op Plan:   Post-operative Plan:   Informed Consent: I have reviewed the patients History and Physical, chart, labs and discussed the procedure including the risks, benefits and alternatives for the proposed anesthesia with the patient or authorized representative who has indicated his/her understanding and acceptance.       Plan Discussed with:   Anesthesia Plan Comments:         Anesthesia Quick Evaluation  

## 2019-03-03 NOTE — Progress Notes (Signed)
Comfortable with epidural Afeb, VSS FHT- 110s, Cat I VE-4/70/-1, vtx, AROM clear Continue pitocin, monitor progress, anticipate VBAC

## 2019-03-03 NOTE — Progress Notes (Signed)
Comfortable Afeb, VSS FHT- 100-110, mod variability, + accels, some small variable and early decels, Cat II, ctx q 3-4 min VE-6/80/-1, vtx Will continue pitocin, monitor FHT and progress

## 2019-03-03 NOTE — Progress Notes (Signed)
This note also relates to the following rows which could not be included: Dose (milli-units/min) Oxytocin - Cannot attach notes to extension rows Rate (mL/hr) Oxytocin - Cannot attach notes to extension rows Concentration Oxytocin - Cannot attach notes to extension rows  Assumed care for patient at this time.

## 2019-03-03 NOTE — Progress Notes (Signed)
Still comfortable with epidural Afeb, VSS FHT- 110s, mod variability, some accels, + scalp stim, has had some eraly, some late, and one prolonged decel since AROM, Cat II, ctx q 2-4 min VE 5/70/-1, vtx, IUPC and FSE placed FHT decels seem to have responded to position change, IV bolus, phenylephrine and O2.  Will see how things look with internal monitors and decide if ok to continue to try VBAC

## 2019-03-04 ENCOUNTER — Encounter (HOSPITAL_COMMUNITY): Payer: Self-pay | Admitting: Emergency Medicine

## 2019-03-04 ENCOUNTER — Inpatient Hospital Stay (HOSPITAL_COMMUNITY)
Admission: RE | Admit: 2019-03-04 | Payer: Medicaid Other | Source: Ambulatory Visit | Admitting: Obstetrics and Gynecology

## 2019-03-04 ENCOUNTER — Encounter (HOSPITAL_COMMUNITY): Admission: RE | Payer: Self-pay | Source: Ambulatory Visit

## 2019-03-04 DIAGNOSIS — O34219 Maternal care for unspecified type scar from previous cesarean delivery: Secondary | ICD-10-CM | POA: Diagnosis not present

## 2019-03-04 LAB — RPR: RPR Ser Ql: NONREACTIVE

## 2019-03-04 SURGERY — Surgical Case
Anesthesia: Spinal

## 2019-03-04 MED ORDER — ONDANSETRON HCL 4 MG/2ML IJ SOLN: 4.0000 mg | INTRAMUSCULAR | Status: DC | PRN

## 2019-03-04 MED ORDER — METHYLERGONOVINE MALEATE 0.2 MG/ML IJ SOLN: 0.2000 mg | INTRAMUSCULAR | Status: DC | PRN

## 2019-03-04 MED ORDER — ZOLPIDEM TARTRATE 5 MG PO TABS: 5.0000 mg | ORAL_TABLET | Freq: Every evening | ORAL | Status: DC | PRN

## 2019-03-04 MED ORDER — OXYCODONE HCL 5 MG PO TABS: 10.0000 mg | ORAL_TABLET | ORAL | Status: DC | PRN

## 2019-03-04 MED ORDER — MEASLES, MUMPS & RUBELLA VAC IJ SOLR: 0.5000 mL | Freq: Once | INTRAMUSCULAR | Status: DC

## 2019-03-04 MED ORDER — COCONUT OIL OIL: 1.0000 "application " | TOPICAL_OIL | Status: DC | PRN

## 2019-03-04 MED ORDER — ONDANSETRON HCL 4 MG PO TABS: 4.0000 mg | ORAL_TABLET | ORAL | Status: DC | PRN

## 2019-03-04 MED ORDER — IBUPROFEN 600 MG PO TABS
600.0000 mg | ORAL_TABLET | Freq: Four times a day (QID) | ORAL | Status: DC
  Administered 2019-03-04 – 2019-03-06 (×8): 600 mg via ORAL
  Filled 2019-03-04 (×8): qty 1

## 2019-03-04 MED ORDER — DIPHENHYDRAMINE HCL 25 MG PO CAPS: 25.0000 mg | ORAL_CAPSULE | Freq: Four times a day (QID) | ORAL | Status: DC | PRN

## 2019-03-04 MED ORDER — DIBUCAINE (PERIANAL) 1 % EX OINT: 1.0000 "application " | TOPICAL_OINTMENT | CUTANEOUS | Status: DC | PRN

## 2019-03-04 MED ORDER — MAGNESIUM HYDROXIDE 400 MG/5ML PO SUSP: 30.0000 mL | ORAL | Status: DC | PRN

## 2019-03-04 MED ORDER — WITCH HAZEL-GLYCERIN EX PADS: 1.0000 "application " | MEDICATED_PAD | CUTANEOUS | Status: DC | PRN

## 2019-03-04 MED ORDER — SIMETHICONE 80 MG PO CHEW: 80.0000 mg | CHEWABLE_TABLET | ORAL | Status: DC | PRN

## 2019-03-04 MED ORDER — ACETAMINOPHEN 325 MG PO TABS
650.0000 mg | ORAL_TABLET | ORAL | Status: DC | PRN
  Administered 2019-03-04: 18:00:00 650 mg via ORAL
  Filled 2019-03-04: qty 2

## 2019-03-04 MED ORDER — BENZOCAINE-MENTHOL 20-0.5 % EX AERO
1.0000 "application " | INHALATION_SPRAY | CUTANEOUS | Status: DC | PRN
  Filled 2019-03-04: qty 56

## 2019-03-04 MED ORDER — SENNOSIDES-DOCUSATE SODIUM 8.6-50 MG PO TABS
2.0000 | ORAL_TABLET | ORAL | Status: DC
  Administered 2019-03-04 – 2019-03-05 (×2): 2 via ORAL
  Filled 2019-03-04 (×2): qty 2

## 2019-03-04 MED ORDER — TETANUS-DIPHTH-ACELL PERTUSSIS 5-2.5-18.5 LF-MCG/0.5 IM SUSP: 0.5000 mL | Freq: Once | INTRAMUSCULAR | Status: DC

## 2019-03-04 MED ORDER — OXYCODONE HCL 5 MG PO TABS: 5.0000 mg | ORAL_TABLET | ORAL | Status: DC | PRN

## 2019-03-04 MED ORDER — SODIUM CHLORIDE 0.9 % IV SOLN
3.0000 g | Freq: Four times a day (QID) | INTRAVENOUS | Status: DC
  Administered 2019-03-04: 3 g via INTRAVENOUS
  Filled 2019-03-04 (×5): qty 3

## 2019-03-04 MED ORDER — PRENATAL MULTIVITAMIN CH
1.0000 | ORAL_TABLET | Freq: Every day | ORAL | Status: DC
  Administered 2019-03-04 – 2019-03-05 (×2): 1 via ORAL
  Filled 2019-03-04 (×2): qty 1

## 2019-03-04 MED ORDER — METHYLERGONOVINE MALEATE 0.2 MG PO TABS: 0.2000 mg | ORAL_TABLET | ORAL | Status: DC | PRN

## 2019-03-04 NOTE — Anesthesia Postprocedure Evaluation (Signed)
Anesthesia Post Note  Patient: NORRINE TRUGLIO  Procedure(s) Performed: AN AD HOC LABOR EPIDURAL     Patient location during evaluation: Mother Baby Anesthesia Type: Epidural Level of consciousness: awake and alert Pain management: pain level controlled Vital Signs Assessment: post-procedure vital signs reviewed and stable Respiratory status: spontaneous breathing and nonlabored ventilation Cardiovascular status: blood pressure returned to baseline Postop Assessment: no headache, no backache and able to ambulate Anesthetic complications: no    Last Vitals:  Vitals:   03/04/19 1219 03/04/19 1643  BP: 116/71 114/76  Pulse: 79 77  Resp: 20 20  Temp: 36.7 C 36.8 C  SpO2:      Last Pain:  Vitals:   03/04/19 1643  TempSrc:   PainSc: 0-No pain   Pain Goal:                Epidural/Spinal Function Cutaneous sensation: Normal sensation (03/04/19 1643)  Sherrie George Chavonne Sforza

## 2019-03-04 NOTE — Lactation Note (Signed)
This note was copied from a baby's chart. Lactation Consultation Note  Patient Name: Krystal Todd [id number] Date: 03/04/2019 Reason for consult: Initial assessment;Term  5 hours old FT female who is being exclusively BF by his mother, she's a P2 but not very experienced BF. She was able to BF her first child only for 1 week due to a difficult latch. Mom participated in the [facility] program at the [facility] but she wasn't familiar with hand expression. LC showed mom how to hand express, she did teach back and was able to get 2 big drops of colostrum out of her left breast. LC showed parents how to finger feed baby. Mom has a [employer] DEBP at home.  Baby already cueing when entering the room, offered assistance with latch and mom agreed to have baby STS. LC took baby STS to mom's left breast in football position per her request and he was able to latch with very little difficulty. A few audible swallows were noted upon breast compressions, baby still nursing when exiting the room at the 5 minutes mark. Mom had a fever of 101 prior delivery, checked with RN Suzie but she hasn't had any respiratory symptoms so far. Reviewed normal newborn behavior, feeding cues and cluster feeding.  Feeding plan:  1. Encouraged mom to feed baby STS 8-12 times/24 hours or sooner if feeding cues are present 2. Hand expression and finger/spoon feeding were also encouraged  BF brochure and feeding diary were reviewed. Parents reported all questions and concerns were answered, they're both aware of LC services and will call PRN.  Maternal Data Formula Feeding for Exclusion: No Has patient been taught Hand Expression?: Yes Does the patient have breastfeeding experience prior to this delivery?: Yes  Feeding Feeding Type: Breast Fed  LATCH Score Latch: Grasps breast easily, tongue down, lips flanged, rhythmical sucking.  Audible Swallowing: A few with stimulation(with breast compressions)  Type of Nipple: Everted at rest  and after stimulation  Comfort (Breast/Nipple): Soft / non-tender  Hold (Positioning): Assistance needed to correctly position infant at breast and maintain latch.  LATCH Score: 8  Interventions Interventions: Breast feeding basics reviewed;Assisted with latch;Skin to skin;Breast massage;Support pillows;Adjust position;Breast compression;Hand express  Lactation Tools Discussed/Used WIC Program: Yes   Consult Status Consult Status: PRN Date: 03/05/19 Follow-up type: In-patient    Ridhima Golberg Venetia Constable 03/04/2019, 1:31 PM

## 2019-03-05 MED ORDER — IBUPROFEN 600 MG PO TABS: 600.0000 mg | ORAL_TABLET | Freq: Four times a day (QID) | ORAL | 0 refills | Status: DC

## 2019-03-05 NOTE — Lactation Note (Signed)
This note was copied from a baby's chart. Lactation Consultation Note  Patient Name: Krystal Todd DXAJO'I Date: 03/05/2019   Parents request to see lactation.  Baby Krystal Krystal Todd now 61 hours old.  Mom reports she was trying to latch him and he was so upset she could not get him latched.  Showed parents how to hand express and spoon feed back small drops of colostrum via spoon.  Urged parents to do this tonight past breastfeeds. Urged parents to feed back whatever she can get out and get him to take.  Mom asked if there was another position besides football.  Showed mom how to do laid back breastfeeding.  Mom laid back slightly and infant latched with minimal ssist.  He wanted to bury his head so showed mom how to shape her breast to help him get more in his mouth and keep his head from being buried.  Mom appreciative. Left mom and baby breastfeeding well.  Mom reports comfort.  Urged parents to call lactation as needed.  Maternal Data    Feeding Feeding Type: Breast Fed  [facility] Score                   Interventions    Lactation Tools Discussed/Used     Consult Status      Krystal Todd Krystal Todd 03/05/2019, 7:24 PM

## 2019-03-05 NOTE — Discharge Instructions (Signed)
As per discharge pamphlet °

## 2019-03-05 NOTE — Lactation Note (Signed)
This note was copied from a baby's chart. Lactation Consultation Note  Patient Name: Boy Saniyyah Meck OEHOZ'Y Date: 03/05/2019 Reason for consult: Follow-up assessment;Term Baby is 25 hours old/4% weight loss.  Mom reports that baby is latching with ease.  Discussed milk coming to volume and the prevention and treatment of engorgement.  Mom denies questions or concerns.  Lactation outpatient services and support reviewed and encouraged prn.  Maternal Data    Feeding Feeding Type: Breast Fed  LATCH Score                   Interventions    Lactation Tools Discussed/Used     Consult Status Consult Status: Complete Follow-up type: Call as needed    Huston Foley 03/05/2019, 9:35 AM

## 2019-03-05 NOTE — Progress Notes (Signed)
PPD #1 No problems, wants to go home Afeb, VSS Fundus firm, NT at U-1 Continue routine postpartum care, d/c home today if baby able to go

## 2019-03-06 MED ORDER — PRENATAL VITAMINS 28-0.8 MG PO TABS: 1.0000 | ORAL_TABLET | Freq: Every day | ORAL | 3 refills | Status: DC

## 2019-03-06 NOTE — Discharge Summary (Signed)
OB Discharge Summary     Patient Name: Krystal Todd DOB: 04/18/1996 MRN: [id number]  Date of admission: 03/03/2019 Delivering MD: Jackelyn KnifeMEISINGER, TODD   Date of discharge: 03/06/2019  Admitting diagnosis: INDUCTION Intrauterine pregnancy: 5436w1d     Secondary diagnosis:  Active Problems:   Indication for care in labor or delivery   VBAC, delivered  Additional problems: VAVD     Discharge diagnosis: Term Pregnancy Delivered                                                                                                Post partum procedures:N/A  Augmentation: AROM and Pitocin  Complications: None  Hospital course:  Induction of Labor With Vaginal Delivery   23 y.o. yo [id number] at 6436w1d was admitted to the hospital 03/03/2019 for induction of labor.  Indication for induction: Favorable cervix at term.  Patient had an uncomplicated labor course as follows: Membrane Rupture Time/Date: 6:00 PM ,03/03/2019   Intrapartum Procedures: Episiotomy: None [1]                                         Lacerations:  2nd degree [3];Perineal [11]  Patient had delivery of a Viable infant.  Information for the patient's newborn:  Frederich ChickGruber, Boy Eola [[id number]][[id number]]  Delivery Method: VBAC, Vacuum Assisted(Filed from Delivery Summary)   03/04/2019  Details of delivery can be found in separate delivery note.  Patient had a routine postpartum course. Patient is discharged home 03/06/19.  Physical exam  Vitals:   03/04/19 2316 03/05/19 1454 03/05/19 2125 03/06/19 0541  BP: 120/72 116/73 111/86 124/80  Pulse: 76 69 73 67  Resp: 18 18 18 16   Temp: 97.6 F (36.4 C) 98.2 F (36.8 C) 97.7 F (36.5 C) 97.9 F (36.6 C)  TempSrc: Oral Oral Oral Oral  SpO2:  99% 100%   Weight:      Height:       General: alert and no distress Lochia: appropriate Uterine Fundus: firm  Labs: Lab Results  Component Value Date   WBC 7.5 03/03/2019   HGB 12.5 03/03/2019   HCT 38.3 03/03/2019   MCV 92.1 03/03/2019   PLT 171 03/03/2019   CMP Latest Ref Rng & Units 03/12/2016  Glucose 65 - 99 mg/dL 90  BUN 6 - 20 mg/dL 10  Creatinine 1.300.44 - 8.651.00 mg/dL 7.840.68  Sodium 696135 - 295145 mmol/L 142  Potassium 3.5 - 5.1 mmol/L 4.0  Chloride 101 - 111 mmol/L 110  CO2 22 - 32 mmol/L 22  Calcium 8.9 - 10.3 mg/dL 9.0  Total Protein 6.5 - 8.1 g/dL 7.7  Total Bilirubin 0.3 - 1.2 mg/dL 0.4  Alkaline Phos 38 - 126 U/L 91  AST 15 - 41 U/L 21  ALT 14 - 54 U/L 37    Discharge instruction: per After Visit Summary and "Baby and Me Booklet".  After visit meds:  Allergies as of 03/06/2019   No Known Allergies     Medication List    STOP  taking these medications   ondansetron 4 MG disintegrating tablet Commonly known as:  Zofran ODT     TAKE these medications   ibuprofen 600 MG tablet Commonly known as:  ADVIL,MOTRIN Take 1 tablet (600 mg total) by mouth every 6 (six) hours.   Prenatal Vitamins 28-0.8 MG Tabs Take 1 tablet by mouth daily.       Diet: routine diet  Activity: Advance as tolerated. Pelvic rest for 6 weeks.   Outpatient follow up:6 weeks Follow up Appt:No future appointments. Follow up Visit:No follow-ups on file.  Postpartum contraception: Undecided  Newborn Data: Live born female  Birth Weight: 7 lb 4.4 oz (3300 g) APGAR: 8, 9  Newborn Delivery   Birth date/time:  03/04/2019 07:52:00 Delivery type:  VBAC, Vacuum Assisted     Baby Feeding: Breast Disposition:home with mother   03/06/2019 Sherian Rein, MD

## 2019-03-06 NOTE — Progress Notes (Signed)
Post Partum Day 2 - VBAC/VAVD Subjective: no complaints, up ad lib, voiding, tolerating PO and nl lochia, pain controlled  Objective: Blood pressure 124/80, pulse 67, temperature 97.9 F (36.6 C), temperature source Oral, resp. rate 16, height 5\' 2"  (1.575 m), weight 85.7 kg, last menstrual period 05/27/2018, SpO2 100 %, unknown if currently breastfeeding.  Physical Exam:  General: alert and no distress Lochia: appropriate Uterine Fundus: firm   Recent Labs    03/03/19 1012  HGB 12.5  HCT 38.3    Assessment/Plan: Discharge home, Breastfeeding and Lactation consult.  Routine care.  D/C home with motrin and PNV.  F/u 6 wk   LOS: 3 days   Krystal Todd 03/06/2019, 8:21 AM

## 2019-03-06 NOTE — Lactation Note (Signed)
This note was copied from a baby's chart. Lactation Consultation Note Mom called out wanting formula. Spoke w/mom regarding supplementing. Mom stated she wasn't going to supplement she was only going to formula feed. Mom tried w/her first child for 1 week. Mom doesn't like BF wishes to formula feed only. Reviewed feeding amounts. Similac given.  Discussed how to dry up milk. Also reviewed pumping and storing milk and giving in bottles. Discussed breast care w/engorgement. Reported to RN.  Patient Name: Boy Ambernicole Usrey UXLKG'M Date: 03/06/2019 Reason for consult: Mother's request   Maternal Data    Feeding Feeding Type: Formula Nipple Type: Slow - flow  LATCH Score                   Interventions    Lactation Tools Discussed/Used     Consult Status Consult Status: Complete Date: 03/06/19    Charyl Dancer 03/06/2019, 4:17 AM

## 2020-01-31 ENCOUNTER — Ambulatory Visit (INDEPENDENT_AMBULATORY_CARE_PROVIDER_SITE_OTHER): Payer: No Typology Code available for payment source | Admitting: Otolaryngology

## 2020-01-31 ENCOUNTER — Encounter (INDEPENDENT_AMBULATORY_CARE_PROVIDER_SITE_OTHER): Payer: Self-pay | Admitting: Otolaryngology

## 2020-01-31 ENCOUNTER — Other Ambulatory Visit: Payer: Self-pay

## 2020-01-31 VITALS — Temp 97.9°F

## 2020-01-31 DIAGNOSIS — H6123 Impacted cerumen, bilateral: Secondary | ICD-10-CM

## 2020-01-31 NOTE — Progress Notes (Signed)
HPI: Krystal Todd is a 24 y.o. female who presents for evaluation of ears.  She apparently cleans her ears with ear rinses every other day.  More recently has had some discomfort and fullness mostly in the right ear. She previously had her ears cleaned here in September of last year..  Past Medical History:  Diagnosis Date  . Chronic kidney disease    frequent UTI's  . Medical history non-contributory    Past Surgical History:  Procedure Laterality Date  . CESAREAN SECTION N/A 06/13/2015   Procedure: CESAREAN SECTION;  Surgeon: Lavina Hamman, MD;  Location: [facility];  Service: Obstetrics;  Laterality: N/A;  . NO PAST SURGERIES     Social History   Socioeconomic History  . Marital status: Married    Spouse name: Not on file  . Number of children: Not on file  . Years of education: Not on file  . Highest education level: Not on file  Occupational History  . Not on file  Tobacco Use  . Smoking status: Former Smoker    Packs/day: 0.25    Years: 2.00    Pack years: 0.50    Start date: 2015    Quit date: 02/24/2016    Years since quitting: 3.9  . Smokeless tobacco: Never Used  Substance and Sexual Activity  . Alcohol use: Not Currently    Comment: LAST DRANK-  ON SUN  . Drug use: No  . Sexual activity: Not Currently    Comment: last sex end of Nov  Other Topics Concern  . Not on file  Social History Narrative  . Not on file   Social Determinants of Health   Financial Resource Strain: Low Risk   . Difficulty of Paying Living Expenses: Not hard at all  Food Insecurity: No Food Insecurity  . Worried About Programme researcher, broadcasting/film/video in the Last Year: Never true  . Ran Out of Food in the Last Year: Never true  Transportation Needs: Unknown  . Lack of Transportation (Medical): No  . Lack of Transportation (Non-Medical): Not on file  Physical Activity:   . Days of Exercise per Week: Not on file  . Minutes of Exercise per Session: Not on file  Stress: No Stress Concern Present   . Feeling of Stress : Only a little  Social Connections:   . Frequency of Communication with Friends and Family: Not on file  . Frequency of Social Gatherings with Friends and Family: Not on file  . Attends Religious Services: Not on file  . Active Member of Clubs or Organizations: Not on file  . Attends Banker Meetings: Not on file  . Marital Status: Not on file   Family History  Problem Relation Age of Onset  . Cancer Maternal Grandmother    No Known Allergies Prior to Admission medications   Medication Sig Start Date End Date Taking? Authorizing Provider  ibuprofen (ADVIL,MOTRIN) 600 MG tablet Take 1 tablet (600 mg total) by mouth every 6 (six) hours. 03/05/19  Yes Meisinger, Todd, MD  Prenatal Vit-Fe Fumarate-FA (PRENATAL VITAMINS) 28-0.8 MG TABS Take 1 tablet by mouth daily. 03/06/19  Yes Bovard-Stuckert, Jody, MD     Positive ROS: Otherwise negative  All other systems have been reviewed and were otherwise negative with the exception of those mentioned in the HPI and as above.  Physical Exam: Constitutional: Alert, well-appearing, no acute distress Ears: External ears without lesions or tenderness.  She has small ear canals bilaterally.  I was able  to clean the ears today using suction and hydroperoxide.  The TMs are clear bilaterally.  She had slight inflammatory changes of both ear canals.. Nasal: External nose without lesions. Clear nasal passages Oral: Oropharynx clear. Neck: No palpable adenopathy or masses Respiratory: Breathing comfortably  Skin: No facial/neck lesions or rash noted.  Cerumen impaction removal  Date/Time: 01/31/2020 2:18 PM Performed by: Rozetta Nunnery, MD Authorized by: Rozetta Nunnery, MD   Consent:    Consent obtained:  Verbal   Consent given by:  Patient   Risks discussed:  Pain and bleeding Procedure details:    Location:  L ear and R ear   Procedure type: curette and suction   Post-procedure details:     Inspection:  TM intact and canal normal   Hearing quality:  Improved   Patient tolerance of procedure:  Tolerated well, no immediate complications Comments:     She has small ear canals bilaterally with slight inflammatory changes.  After cleaning the ear canals with suction and half peroxide TMs are otherwise clear.    Assessment: Cerumen buildup bilaterally. Small ear canals bilaterally.  Mild inflammatory changes.  Plan: Recommended washing the ears out no more than every other week. Did not recommend any antibiotic therapy at this point although I applied CSF powder to both ears. She will follow-up as needed next time she has a problem recommended keeping the ear dry at this point.  Radene Journey, MD

## 2022-01-02 ENCOUNTER — Emergency Department (HOSPITAL_BASED_OUTPATIENT_CLINIC_OR_DEPARTMENT_OTHER)
Admission: EM | Admit: 2022-01-02 | Discharge: 2022-01-02 | Disposition: A | Discharge status: Home / Self Care | Payer: Medicaid Other | Attending: Emergency Medicine | Admitting: Emergency Medicine

## 2022-01-02 ENCOUNTER — Encounter (HOSPITAL_BASED_OUTPATIENT_CLINIC_OR_DEPARTMENT_OTHER): Payer: Self-pay | Admitting: Emergency Medicine

## 2022-01-02 ENCOUNTER — Other Ambulatory Visit: Payer: Self-pay

## 2022-01-02 DIAGNOSIS — N760 Acute vaginitis: Secondary | ICD-10-CM | POA: Insufficient documentation

## 2022-01-02 DIAGNOSIS — N898 Other specified noninflammatory disorders of vagina: Secondary | ICD-10-CM

## 2022-01-02 DIAGNOSIS — B9689 Other specified bacterial agents as the cause of diseases classified elsewhere: Secondary | ICD-10-CM | POA: Insufficient documentation

## 2022-01-02 LAB — WET PREP, GENITAL
Sperm: NONE SEEN
Trich, Wet Prep: NONE SEEN
WBC, Wet Prep HPF POC: <10 (ref <10)
Yeast Wet Prep HPF POC: NONE SEEN

## 2022-01-02 LAB — URINALYSIS, ROUTINE W REFLEX MICROSCOPIC
Bilirubin Urine: NEGATIVE
Glucose, UA: NEGATIVE mg/dL
Hgb urine dipstick: NEGATIVE
Ketones, ur: NEGATIVE mg/dL
Leukocytes,Ua: NEGATIVE
Nitrite: NEGATIVE
Protein, ur: NEGATIVE mg/dL
Specific Gravity, Urine: 1.024 (ref 1.005–1.030)
pH: 6 (ref 5.0–8.0)

## 2022-01-02 LAB — PREGNANCY, URINE: Preg Test, Ur: NEGATIVE

## 2022-01-02 MED ORDER — METRONIDAZOLE 500 MG PO TABS: 500.0000 mg | ORAL_TABLET | Freq: Two times a day (BID) | ORAL | 0 refills | Status: DC

## 2022-01-02 NOTE — ED Triage Notes (Signed)
Pt endorses vaginal discharge and itching for 2 days. Reports new sexual partner and wanting STD and pregnancy test.

## 2022-01-02 NOTE — Discharge Instructions (Addendum)
Call your OB/GYN doctor to arrange follow-up this week.  Your pregnancy test was negative.  However you did have some bacteria growing concerning for bacterial vaginosis which will need antibiotics.  Avoid sexual intercourse until you have been cleared by your OB/GYN doctor.  You can follow-up your other test results on your MyChart application in 1 to 2 days.  You will need to follow-up this result with your OB/GYN doctor.

## 2022-01-02 NOTE — ED Provider Notes (Addendum)
[facility] EMERGENCY DEPT Provider Note   CSN: [id number] Arrival date & time: 01/02/22  1853     History  Chief Complaint  Patient presents with   [facility] TRANSMITTED DISEASE    Krystal Todd is a 26 y.o. female.  Patient presents ER chief complaint of vaginal discharge and itchiness for 2 days.  She says she is sexually active with 1 partner for several months now and does not use a condom.  She denies any prior history of STD.  Denies any abdominal pain.  She states that she had some vaginal spotting last week she is concerned she may be pregnant but has not taken a test at home.  Denies fevers or vomiting or diarrhea denies any pain at this time.      Home Medications Prior to Admission medications   Medication Sig Start Date End Date Taking? Authorizing Provider  metroNIDAZOLE (FLAGYL) 500 MG tablet Take 1 tablet (500 mg total) by mouth 2 (two) times daily. 01/02/22  Yes Cheryll Cockayne, MD  ibuprofen (ADVIL,MOTRIN) 600 MG tablet Take 1 tablet (600 mg total) by mouth every 6 (six) hours. 03/05/19   Meisinger, Tawanna Cooler, MD  Prenatal Vit-Fe Fumarate-FA (PRENATAL VITAMINS) 28-0.8 MG TABS Take 1 tablet by mouth daily. 03/06/19   Bovard-Stuckert, Augusto Gamble, MD      Allergies    Patient has no known allergies.    Review of Systems   Review of Systems  Constitutional:  Negative for fever.  HENT:  Negative for ear pain.   Eyes:  Negative for pain.  Respiratory:  Negative for cough.   Cardiovascular:  Negative for chest pain.  Gastrointestinal:  Negative for abdominal pain.  Genitourinary:  Negative for flank pain.  Musculoskeletal:  Negative for back pain.  Skin:  Negative for rash.  Neurological:  Negative for headaches.   Physical Exam Updated Vital Signs BP 121/83    Pulse (!) 54    Temp 98.2 F (36.8 C) (Oral)    Resp 20    Ht 5\' 2"  (1.575 m)    Wt 61.2 kg    SpO2 100%    BMI 24.69 kg/m  Physical Exam Constitutional:      General: She is not in acute  distress.    Appearance: Normal appearance.  HENT:     Head: Normocephalic.     Nose: Nose normal.  Eyes:     Extraocular Movements: Extraocular movements intact.  Cardiovascular:     Rate and Rhythm: Normal rate.  Pulmonary:     Effort: Pulmonary effort is normal.  Genitourinary:    Comments: Small amount of clear mucus discharge noted on speculum exam with nursing chaperone present.  No cervical motion tenderness noted. Musculoskeletal:        General: Normal range of motion.     Cervical back: Normal range of motion.  Neurological:     General: No focal deficit present.     Mental Status: She is alert. Mental status is at baseline.    ED Results / Procedures / Treatments   Labs (all labs ordered are listed, but only abnormal results are displayed) Labs Reviewed  WET PREP, GENITAL - Abnormal; Notable for the following components:      Result Value   Clue Cells Wet Prep HPF POC PRESENT (*)    All other components within normal limits  PREGNANCY, URINE  URINALYSIS, ROUTINE W REFLEX MICROSCOPIC  GC/CHLAMYDIA PROBE AMP ([place]) NOT AT [facility]    EKG None  Radiology No results found.  Procedures Procedures    Medications Ordered in ED Medications - No data to display  ED Course/ Medical Decision Making/ A&P                           Medical Decision Making Amount and/or Complexity of Data Reviewed Labs: ordered.  Risk Prescription drug management.   Chart review shows office visit November 12, 2019 to primary care doctor for influenza-like symptoms.  Pregnancy test is negative urinalysis normal.  (Set positive for clue cells.  Gonorrhea chlamydia test sent and pending.  Patient advised to follow-up on MyChart regarding final results.  I advised her that she will need to follow-up with her OB/GYN doctor depending on the results as well.  Advised no sexual intercourse until she has been cleared by her OB/GYN doctor.  We will give Flagyl for her positive  clue cells on the wet mount.  Recommending immediate return for fevers pain worsening symptoms or any additional concerns.        Final Clinical Impression(s) / ED Diagnoses Final diagnoses:  Vaginal discharge  Bacterial vaginosis    Rx / DC Orders ED Discharge Orders          Ordered    metroNIDAZOLE (FLAGYL) 500 MG tablet  2 times daily        01/02/22 2139              Cheryll Cockayne, MD 01/02/22 2140    Cheryll Cockayne, MD 01/02/22 2140

## 2022-01-05 LAB — GC/CHLAMYDIA PROBE AMP (~~LOC~~) NOT AT ARMC
Chlamydia: NEGATIVE
Comment: NEGATIVE
Comment: NORMAL
Neisseria Gonorrhea: NEGATIVE

## 2022-12-15 ENCOUNTER — Encounter (HOSPITAL_BASED_OUTPATIENT_CLINIC_OR_DEPARTMENT_OTHER): Payer: No Typology Code available for payment source | Admitting: Advanced Practice Midwife

## 2023-01-12 ENCOUNTER — Encounter (HOSPITAL_BASED_OUTPATIENT_CLINIC_OR_DEPARTMENT_OTHER): Payer: Self-pay | Admitting: Advanced Practice Midwife

## 2023-01-12 ENCOUNTER — Ambulatory Visit (INDEPENDENT_AMBULATORY_CARE_PROVIDER_SITE_OTHER): Payer: Medicaid Other | Admitting: Advanced Practice Midwife

## 2023-01-12 ENCOUNTER — Other Ambulatory Visit (HOSPITAL_COMMUNITY)
Admission: RE | Admit: 2023-01-12 | Discharge: 2023-01-12 | Disposition: A | Discharge status: Home / Self Care | Payer: Medicaid Other | Source: Ambulatory Visit | Attending: Obstetrics & Gynecology | Admitting: Obstetrics & Gynecology

## 2023-01-12 VITALS — BP 138/74 | HR 73 | Ht 62.0 in | Wt 156.6 lb

## 2023-01-12 DIAGNOSIS — Z01419 Encounter for gynecological examination (general) (routine) without abnormal findings: Secondary | ICD-10-CM | POA: Diagnosis not present

## 2023-01-12 DIAGNOSIS — Z124 Encounter for screening for malignant neoplasm of cervix: Secondary | ICD-10-CM | POA: Insufficient documentation

## 2023-01-12 DIAGNOSIS — B3731 Acute candidiasis of vulva and vagina: Secondary | ICD-10-CM

## 2023-01-12 DIAGNOSIS — Z113 Encounter for screening for infections with a predominantly sexual mode of transmission: Secondary | ICD-10-CM

## 2023-01-12 DIAGNOSIS — A6000 Herpesviral infection of urogenital system, unspecified: Secondary | ICD-10-CM

## 2023-01-12 MED ORDER — VALACYCLOVIR HCL 1 G PO TABS: 1000.0000 mg | ORAL_TABLET | Freq: Every day | ORAL | 5 refills | Status: AC

## 2023-01-12 NOTE — Progress Notes (Signed)
Subjective:     Krystal Todd is a 27 y.o. female here at [facility] for a routine exam.  Current complaints: Pt was dx with HSV with vaginal lesion in 2022. She had one additional outbreak after the initial dx but none since.  She is concerned about risks to future pregnancy, chances of outbreak in other places like on her mouth, and wants full STI testing.  Personal health questionnaire reviewed: yes.  Do you have a primary care provider? yes Do you feel safe at home? yes  [employer] Visit from 01/12/2023 in [facility]  PHQ-2 Total Score 1       Health Maintenance Due  Topic Date Due   Hepatitis C Screening  Never done   DTaP/Tdap/Td (1 - Tdap) Never done   PAP-Cervical Cytology Screening  Never done   PAP SMEAR-Modifier  Never done   INFLUENZA VACCINE  06/23/2022   COVID-19 Vaccine (3 - 2023-24 season) 07/24/2022     Risk factors for chronic health problems: Smoking: quit in 2017 Alchohol/how much: social Pt BMI: Body mass index is 28.64 kg/m.   Gynecologic History Patient's last menstrual period was 01/12/2023. Contraception: none Last Pap: 2021. Results were: normal Last mammogram: n/a.  Obstetric History OB History  Gravida Para Term Preterm AB Living  3 2 2   1 2  $ SAB IAB Ectopic Multiple Live Births  1     0 2    # Outcome Date GA Lbr Len/2nd Weight Sex Delivery Anes PTL Lv  3 Term 03/04/19 [redacted]w[redacted]d/ 01:14 7 lb 4.4 oz (3.3 kg) M VBAC, Vacuum EPI  LIV  2 Term 06/13/15 442w1d7 lb 6.5 oz (3.36 kg) M CS-LTranv EPI  LIV  1 SAB              The following portions of the patient's history were reviewed and updated as appropriate: allergies, current medications, past family history, past medical history, past social history, past surgical history, and problem list.  Review of Systems Pertinent items noted in HPI and remainder of comprehensive ROS otherwise negative.    Objective:   VS  reviewed, nursing note reviewed,  Constitutional: well developed, well nourished, no distress HEENT: normocephalic, thyroid without enlargement or mass HEART: RRR, no murmurs rubs/gallops RESP: clear and equal to auscultation bilaterally in all lobes  Breast Exam:exam performed: right breast normal without mass, skin or nipple changes or axillary nodes, left breast normal without mass, skin or nipple changes or axillary nodes Abdomen: soft Neuro: alert and oriented x 3 Skin: warm, dry Psych: affect normal Pelvic exam: Performed: Cervix pink, visually closed, without lesion, scant white creamy discharge, vaginal walls and external genitalia normal Bimanual exam: Cervix 0/long/high, firm, anterior, neg CMT, uterus nontender, nonenlarged, adnexa without tenderness, enlargement, or mass        Assessment/Plan:   1. Well woman exam with routine gynecological exam   2. Routine screening for STI (sexually transmitted infection)  - RPR - HIV antibody (with reflex) - Hepatitis B Surface AntiGEN - Hepatitis C Antibody  3. Encounter for screening for cervical cancer  - Cytology - PAP( [place])  4. 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  Also, recurrent HSV lower risk for pt husband and future pregnancies than initial outbreak.  If pt has genital outbreaks, this cannot travel to become cold sores.  If she develops  cold sores, this is a separate exposure to the virus in that area.  Questions answered and reassurance provided. - valACYclovir (VALTREX) 1000 MG tablet; Take 1 tablet (1,000 mg total) by mouth daily for 5 days.  Dispense: 5 tablet; Refill: 5    No follow-ups on file.   Fatima Blank, CNM 3:09 PM

## 2023-01-13 LAB — HIV ANTIBODY (ROUTINE TESTING W REFLEX): HIV Screen 4th Generation wRfx: NONREACTIVE

## 2023-01-13 LAB — HEPATITIS B SURFACE ANTIGEN: Hepatitis B Surface Ag: NEGATIVE

## 2023-01-13 LAB — HEPATITIS C ANTIBODY: Hep C Virus Ab: NONREACTIVE

## 2023-01-13 LAB — RPR: RPR Ser Ql: NONREACTIVE

## 2023-01-14 LAB — CYTOLOGY - PAP
Adequacy: ABSENT
Chlamydia: NEGATIVE
Comment: NEGATIVE
Comment: NEGATIVE
Comment: NORMAL
Diagnosis: NEGATIVE
Neisseria Gonorrhea: NEGATIVE
Trichomonas: NEGATIVE

## 2023-01-17 MED ORDER — FLUCONAZOLE 150 MG PO TABS: 150.0000 mg | ORAL_TABLET | Freq: Once | ORAL | 0 refills | Status: AC

## 2023-01-17 NOTE — Addendum Note (Signed)
Addended by: Fatima Blank A on: 01/17/2023 01:27 AM   Modules accepted: Orders

## 2023-06-10 ENCOUNTER — Ambulatory Visit (HOSPITAL_BASED_OUTPATIENT_CLINIC_OR_DEPARTMENT_OTHER): Payer: Medicaid Other | Admitting: Obstetrics & Gynecology

## 2023-06-14 ENCOUNTER — Encounter (HOSPITAL_BASED_OUTPATIENT_CLINIC_OR_DEPARTMENT_OTHER): Payer: Self-pay | Admitting: Obstetrics & Gynecology

## 2023-06-14 ENCOUNTER — Ambulatory Visit (INDEPENDENT_AMBULATORY_CARE_PROVIDER_SITE_OTHER): Payer: Medicaid Other | Admitting: Obstetrics & Gynecology

## 2023-06-14 VITALS — BP 135/72 | HR 73 | Ht 62.0 in | Wt 160.2 lb

## 2023-06-14 DIAGNOSIS — Z30431 Encounter for routine checking of intrauterine contraceptive device: Secondary | ICD-10-CM

## 2023-06-14 DIAGNOSIS — T8332XA Displacement of intrauterine contraceptive device, initial encounter: Secondary | ICD-10-CM

## 2023-06-14 DIAGNOSIS — Z975 Presence of (intrauterine) contraceptive device: Secondary | ICD-10-CM | POA: Insufficient documentation

## 2023-06-14 NOTE — Progress Notes (Signed)
GYNECOLOGY  VISIT  CC:   non visualized IUD strings  HPI: 27 y.o. G78P2012 Married [place] or [place] American female here for recheck after having on visualized IUD strings noted on exam with Sharen Counter.  Pt has a Mirena IUD that was placed after the birth of her last child (born 03/03/2019).  Her bleeding is heavy for one day and then there is light bleeding for another day or two.  She cannot feel the string.  Has questions about how long to keep IUD.  5 year bleeding indication and 8 year contraception indication discussed.  She is pretty sure she does not want any other children.  Has two.   Past Medical History:  Diagnosis Date   Chronic kidney disease    frequent UTI's   Medical history non-contributory     MEDS:   Current Outpatient Medications on File Prior to Visit  Medication Sig Dispense Refill   LYSINE PO Take by mouth.     Multiple Vitamin (MULTIVITAMIN ADULT PO) Take by mouth. (Patient not taking: Reported on 06/14/2023)     No current facility-administered medications on file prior to visit.    ALLERGIES: Patient has no known allergies.  SH:  married, non smoker  Review of Systems  Constitutional: Negative.   Genitourinary: Negative.     PHYSICAL EXAMINATION:    BP 135/72   Pulse 73   Ht 5\' 2"  (1.575 m)   Wt 160 lb 3.2 oz (72.7 kg)   LMP 06/12/2023   BMI 29.30 kg/m     General appearance: alert, cooperative and appears stated age  Pelvic: External genitalia:  no lesions              Limited u/s was performed bedside.  IUD in correct location with string seen in cervix on imaging.  Ovaries normal.  No uterine masses.  Chaperone, Raechel Ache, RN, was present for exam.  Assessment/Plan: 1. Intrauterine contraceptive device threads lost, initial encounter - IUD seen in correct location on ultrasound.  Pt reassured.  For now, she is planning on keeping IUD for 8 years and will decide then about replacement.

## 2023-06-16 ENCOUNTER — Other Ambulatory Visit (INDEPENDENT_AMBULATORY_CARE_PROVIDER_SITE_OTHER): Payer: Medicaid Other

## 2023-06-16 NOTE — Addendum Note (Signed)
Addended by: Harrie Jeans on: 06/16/2023 12:42 PM   Modules accepted: Orders

## 2023-10-06 ENCOUNTER — Other Ambulatory Visit (HOSPITAL_COMMUNITY)
Admission: RE | Admit: 2023-10-06 | Discharge: 2023-10-06 | Disposition: A | Discharge status: Home / Self Care | Payer: Medicaid Other | Source: Ambulatory Visit | Attending: Certified Nurse Midwife | Admitting: Certified Nurse Midwife

## 2023-10-06 ENCOUNTER — Encounter (HOSPITAL_BASED_OUTPATIENT_CLINIC_OR_DEPARTMENT_OTHER): Payer: Self-pay | Admitting: Certified Nurse Midwife

## 2023-10-06 ENCOUNTER — Ambulatory Visit (HOSPITAL_BASED_OUTPATIENT_CLINIC_OR_DEPARTMENT_OTHER): Payer: Medicaid Other | Admitting: Certified Nurse Midwife

## 2023-10-06 VITALS — BP 112/73 | HR 56 | Ht 62.0 in | Wt 157.0 lb

## 2023-10-06 DIAGNOSIS — A6004 Herpesviral vulvovaginitis: Secondary | ICD-10-CM

## 2023-10-06 DIAGNOSIS — Z30433 Encounter for removal and reinsertion of intrauterine contraceptive device: Secondary | ICD-10-CM

## 2023-10-06 DIAGNOSIS — Z975 Presence of (intrauterine) contraceptive device: Secondary | ICD-10-CM

## 2023-10-06 DIAGNOSIS — Z113 Encounter for screening for infections with a predominantly sexual mode of transmission: Secondary | ICD-10-CM

## 2023-10-06 MED ORDER — VALACYCLOVIR HCL 1 G PO TABS: 1000.0000 mg | ORAL_TABLET | Freq: Every day | ORAL | 12 refills | Status: AC

## 2023-10-06 MED ORDER — LEVONORGESTREL 20 MCG/DAY IU IUD
1.0000 | INTRAUTERINE_SYSTEM | Freq: Once | INTRAUTERINE | Status: AC
  Administered 2023-10-06: 1 via INTRAUTERINE

## 2023-10-06 NOTE — Progress Notes (Signed)
GYNECOLOGY  VISIT   HPI: 27 y.o. G13P2012 Married [place] American female here for new gyn problem visit. Pt has Mirena IUD inserted approx 4.5 years ago. Reports she has had normal monthly periods the entire time. Her partner recently complained of the IUD strings. Pt would also like routine STD screening. 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    Past Medical History:  Diagnosis Date   Chronic kidney disease    frequent UTI's   Medical history non-contributory    MEDS:   Current Outpatient Medications on File Prior to Visit  Medication Sig Dispense Refill   LYSINE PO Take by mouth.     Multiple Vitamin (MULTIVITAMIN ADULT PO) Take by mouth. (Patient not taking: Reported on 06/14/2023)     No current facility-administered medications on file prior to visit.   ALLERGIES: Patient has no known allergies.  PHYSICAL EXAMINATION:    BP 112/73 (BP Location: Right Arm, Patient Position: Sitting, Cuff Size: Normal)   Pulse (!) 56   Ht 5\' 2"  (1.575 m)   Wt 157 lb (71.2 kg)   BMI 28.72 kg/m     General appearance: alert, cooperative and appears stated age  Pelvic: External genitalia:  no lesions              Urethra:  normal appearing urethra with no masses, tenderness or lesions              Bartholins and Skenes: normal                 Vagina: normal mucosa without prolapse or lesions              Cervix: multiparous appearance, no cervical motion tenderness, no lesions, and IUD strings very short (1/4cm)              Bimanual Exam:  Uterus:  normal size, contour, position, consistency, mobility, non-tender              Adnexa: no mass, fullness, tenderness               Chaperone, Hendricks Milo, CMA, was present for exam.  Assessment/Plan: 1. Screen for STD (sexually transmitted disease) - Cervicovaginal ancillary only( [place]) - HIV antibody (with reflex) - Hepatitis B Surface AntiGEN - Hepatitis C Antibody - RPR  2. Herpes simplex  vulvovaginitis - Discussed option for suppressive therapy or recurrence therapy - valACYclovir (VALTREX) 1000 MG tablet; Take 1 tablet (1,000 mg total) by mouth daily.  Dispense: 30 tablet; Refill: 12  3. IUD (intrauterine device) in place - After assessing IUD strings and determine that both strings very short, pt elected to proceed with removal of Mirena IUD and insertion of new Mirena IUD (completed).  - levonorgestrel (MIRENA) 20 MCG/DAY IUD 1 each  4. Encounter for removal and reinsertion of intrauterine contraceptive device (IUD) - See Procedure Note      GYNECOLOGY OFFICE PROCEDURE NOTE  IUD Insertion Procedure Note Patient identified, informed consent performed, consent signed.   Discussed risks of irregular bleeding, cramping, infection, malpositioning or misplacement of the IUD outside the uterus which may require further procedure such as laparoscopy. Time out was performed.    Speculum placed in the vagina.  Cervix visualized.  Cleaned with Betadine x 2.   Uterus sounded to 6 cm.  Mirena IUD placed per manufacturer's recommendations.  Strings trimmed to 3 cm.   Patient tolerated procedure well.   Patient was given post-procedure instructions.  No follow-ups on file.   Letta Kocher, CNM 12:05 PM

## 2023-10-07 LAB — CERVICOVAGINAL ANCILLARY ONLY
Bacterial Vaginitis (gardnerella): POSITIVE — AB
Candida Glabrata: NEGATIVE
Candida Vaginitis: POSITIVE — AB
Chlamydia: NEGATIVE
Comment: NEGATIVE
Comment: NEGATIVE
Comment: NEGATIVE
Comment: NEGATIVE
Comment: NEGATIVE
Comment: NORMAL
Neisseria Gonorrhea: NEGATIVE
Trichomonas: NEGATIVE

## 2023-10-07 LAB — RPR: RPR Ser Ql: NONREACTIVE

## 2023-10-07 LAB — HIV ANTIBODY (ROUTINE TESTING W REFLEX): HIV Screen 4th Generation wRfx: NONREACTIVE

## 2023-10-07 LAB — HEPATITIS C ANTIBODY: Hep C Virus Ab: NONREACTIVE

## 2023-10-07 LAB — HEPATITIS B SURFACE ANTIGEN: Hepatitis B Surface Ag: NEGATIVE
